# Patient Record
Sex: Female | Born: 1960
Health system: Southern US, Community
[De-identification: ages and names within clinical notes are randomized; demographics above are authoritative.]

## PROBLEM LIST (undated history)

## (undated) DIAGNOSIS — I10 Essential (primary) hypertension: Secondary | ICD-10-CM

## (undated) DIAGNOSIS — M1990 Unspecified osteoarthritis, unspecified site: Secondary | ICD-10-CM

## (undated) HISTORY — PX: POLYPECTOMY: SHX149

## (undated) HISTORY — DX: Unspecified osteoarthritis, unspecified site: M19.90

## (undated) HISTORY — PX: COLONOSCOPY: SHX174

## (undated) HISTORY — DX: Essential (primary) hypertension: I10

---
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
## 1986-08-22 HISTORY — PX: OTHER SURGICAL HISTORY: SHX169

## 1988-08-22 HISTORY — PX: OTHER SURGICAL HISTORY: SHX169

## 2000-08-22 HISTORY — PX: REDUCTION MAMMAPLASTY: SUR839

## 2000-08-22 HISTORY — PX: BREAST SURGERY: SHX581

## 2005-08-26 ENCOUNTER — Ambulatory Visit: Payer: Self-pay

## 2006-09-27 ENCOUNTER — Ambulatory Visit: Payer: Self-pay

## 2006-10-25 ENCOUNTER — Encounter: Payer: Self-pay | Admitting: Specialist

## 2006-11-21 ENCOUNTER — Encounter: Payer: Self-pay | Admitting: Specialist

## 2007-03-19 ENCOUNTER — Inpatient Hospital Stay: Payer: Self-pay | Admitting: Surgery

## 2007-07-03 LAB — HM PAP SMEAR: HM Pap smear: NEGATIVE

## 2007-08-23 HISTORY — PX: GALLBLADDER SURGERY: SHX652

## 2007-12-19 ENCOUNTER — Ambulatory Visit: Payer: Self-pay

## 2009-02-26 ENCOUNTER — Ambulatory Visit: Payer: Self-pay

## 2009-12-28 LAB — HM MAMMOGRAPHY: HM Mammogram: NORMAL

## 2010-04-14 ENCOUNTER — Ambulatory Visit: Payer: Self-pay

## 2011-11-28 ENCOUNTER — Telehealth: Payer: Self-pay | Admitting: Internal Medicine

## 2011-11-28 NOTE — Telephone Encounter (Signed)
[phone] Pt called to get new patient appointment gave her 6/12 she wanted to know if she could be seen sooner.  She went to urgent care Friday night and she has high blood pressure they gave her 1 month bp meds and gave her a list of dr for her to call  primary care.

## 2011-11-28 NOTE — Telephone Encounter (Signed)
We can work her in next available 30-63min slot.

## 2011-11-29 NOTE — Telephone Encounter (Signed)
appointment 12/29/11 pt aware

## 2011-12-29 ENCOUNTER — Encounter: Payer: Self-pay | Admitting: Internal Medicine

## 2011-12-29 ENCOUNTER — Ambulatory Visit (INDEPENDENT_AMBULATORY_CARE_PROVIDER_SITE_OTHER): Payer: PRIVATE HEALTH INSURANCE | Admitting: Internal Medicine

## 2011-12-29 VITALS — BP 122/80 | HR 74 | Temp 98.2°F | Ht 68.5 in | Wt 258.5 lb

## 2011-12-29 DIAGNOSIS — Z1239 Encounter for other screening for malignant neoplasm of breast: Secondary | ICD-10-CM

## 2011-12-29 DIAGNOSIS — I10 Essential (primary) hypertension: Secondary | ICD-10-CM

## 2011-12-29 DIAGNOSIS — B351 Tinea unguium: Secondary | ICD-10-CM | POA: Insufficient documentation

## 2011-12-29 LAB — MICROALBUMIN / CREATININE URINE RATIO
Creatinine,U: 363.1 mg/dL
Microalb Creat Ratio: 0.8 mg/g (ref 0.0–30.0)
Microalb, Ur: 2.9 mg/dL — ABNORMAL HIGH (ref 0.0–1.9)

## 2011-12-29 MED ORDER — LOSARTAN POTASSIUM-HCTZ 50-12.5 MG PO TABS: 1.0000 | ORAL_TABLET | Freq: Every day | ORAL | Status: DC

## 2011-12-29 NOTE — Assessment & Plan Note (Signed)
Blood pressure is normal today. Patient has been off lisinopril hydrochlorothiazide for one week. Her description of cough on this medication is concerning for allergy to ACE inhibitor. Will change to losartan hydrochlorothiazide. However, will have her stay off the medication for the next few days and monitor her blood pressure. She will email or call with results. If blood pressure is trending up, greater than 140/90, will plan to restart losartan hydrochlorothiazide. We'll check renal function and urine microalbumin with labs today. Followup in one month.

## 2011-12-29 NOTE — Assessment & Plan Note (Signed)
Exam is consistent with fungal infection of the left great toenail. We discussed treatment with Lamisil. Will check liver function today. If normal, will start Lamisil and plan to recheck liver function in 6 weeks.

## 2011-12-29 NOTE — Progress Notes (Signed)
Subjective:    Patient ID: Heidi Hicks, female    DOB: 1960/12/12, 51 y.o.   MRN: [id number]  HPI 51YO female with h/o hypertension presents to establish care. She reports that she had been periodically checking her BP for several months, whenever visiting her local pharmacy. She noticed her BP was elevated, often greater than 190/100. She thought this was an error on the part of the machine. She denies any symptoms during this time such as headache, chest pain, palpitations. She was then placed on meloxicam for joint pain in her left ankle. She was told by her orthopedic surgeon that her blood pressure is elevated and there was concern that meloxicam might be contributing. She reports that her blood pressure was noted to be as high as 195/114. She ultimately went to urgent care and she was started on lisinopril hydrochlorothiazide. She has been taking this for the last month but has not been checking her blood pressure. She notes some dry cough since starting this medication. She has been out of the medication for the last week.  In regards to her arthritis pain, she reports a long history of left ankle pain secondary to surgical repair of her fracture. She is currently taking Lodine once daily with improvement in her symptoms.  She is also concerned today about some thickening of her left great toenail. She notes this has been present for several months. She tried applying topical fungal treatments with no improvement.  Outpatient Encounter Prescriptions as of 12/29/2011  Medication Sig Dispense Refill  . etodolac (LODINE) 400 MG tablet Take 400 mg by mouth 2 (two) times daily.      Marland Kitchen losartan-hydrochlorothiazide (HYZAAR) 50-12.5 MG per tablet Take 1 tablet by mouth daily.  30 tablet  3    Review of Systems  Constitutional: Negative for fever, chills, appetite change, fatigue and unexpected weight change.  HENT: Negative for ear pain, congestion, sore throat, trouble swallowing, neck pain, voice  change and sinus pressure.   Eyes: Negative for visual disturbance.  Respiratory: Negative for cough, shortness of breath, wheezing and stridor.   Cardiovascular: Negative for chest pain, palpitations and leg swelling.  Gastrointestinal: Negative for nausea, vomiting, abdominal pain, diarrhea, constipation, blood in stool, abdominal distention and anal bleeding.  Genitourinary: Negative for dysuria and flank pain.  Musculoskeletal: Positive for joint swelling and arthralgias. Negative for myalgias and gait problem.  Skin: Negative for color change and rash.  Neurological: Negative for dizziness and headaches.  Hematological: Negative for adenopathy. Does not bruise/bleed easily.  Psychiatric/Behavioral: Negative for suicidal ideas, sleep disturbance and dysphoric mood. The patient is not nervous/anxious.    BP 122/80  Pulse 74  Temp(Src) 98.2 F (36.8 C) (Oral)  Ht 5' 8.5" (1.74 m)  Wt 258 lb 8 oz (117.255 kg)  BMI 38.73 kg/m2  LMP 07/23/2011     Objective:   Physical Exam  Constitutional: She is oriented to person, place, and time. She appears well-developed and well-nourished. No distress.  HENT:  Head: Normocephalic and atraumatic.  Right Ear: External ear normal.  Left Ear: External ear normal.  Nose: Nose normal.  Mouth/Throat: Oropharynx is clear and moist. No oropharyngeal exudate.  Eyes: Conjunctivae are normal. Pupils are equal, round, and reactive to light. Right eye exhibits no discharge. Left eye exhibits no discharge. No scleral icterus.  Neck: Normal range of motion. Neck supple. No tracheal deviation present. No thyromegaly present.  Cardiovascular: Normal rate, regular rhythm, normal heart sounds and intact distal pulses.  Exam reveals  no gallop and no friction rub.   No murmur heard. Pulmonary/Chest: Effort normal and breath sounds normal. No respiratory distress. She has no wheezes. She has no rales. She exhibits no tenderness.  Abdominal: Soft. Bowel sounds are  normal. She exhibits no distension and no mass. There is no tenderness. There is no guarding.  Musculoskeletal: She exhibits no edema and no tenderness.       Left foot: She exhibits decreased range of motion and swelling.  Lymphadenopathy:    She has no cervical adenopathy.  Neurological: She is alert and oriented to person, place, and time. No cranial nerve deficit. She exhibits normal muscle tone. Coordination normal.  Skin: Skin is warm and dry. No rash noted. She is not diaphoretic. No erythema. No pallor.  Psychiatric: She has a normal mood and affect. Her behavior is normal. Judgment and thought content normal.          Assessment & Plan:

## 2011-12-30 LAB — COMPREHENSIVE METABOLIC PANEL
ALT: 23 U/L (ref 0–35)
CO2: 27 mEq/L (ref 19–32)
Creatinine, Ser: 1 mg/dL (ref 0.4–1.2)
GFR: 73.83 mL/min (ref >60.00)
Glucose, Bld: 81 mg/dL (ref 70–99)
Total Bilirubin: 0.6 mg/dL (ref 0.3–1.2)

## 2012-01-02 ENCOUNTER — Encounter: Payer: Self-pay | Admitting: Internal Medicine

## 2012-01-12 ENCOUNTER — Encounter: Payer: Self-pay | Admitting: Internal Medicine

## 2012-01-12 DIAGNOSIS — B351 Tinea unguium: Secondary | ICD-10-CM

## 2012-01-12 MED ORDER — TERBINAFINE HCL 250 MG PO TABS: 250.0000 mg | ORAL_TABLET | Freq: Every day | ORAL | Status: DC

## 2012-01-30 ENCOUNTER — Encounter: Payer: Self-pay | Admitting: Internal Medicine

## 2012-01-30 ENCOUNTER — Ambulatory Visit (INDEPENDENT_AMBULATORY_CARE_PROVIDER_SITE_OTHER): Payer: PRIVATE HEALTH INSURANCE | Admitting: Internal Medicine

## 2012-01-30 VITALS — BP 132/84 | HR 69 | Temp 98.3°F | Resp 14 | Wt 263.2 lb

## 2012-01-30 DIAGNOSIS — E669 Obesity, unspecified: Secondary | ICD-10-CM

## 2012-01-30 DIAGNOSIS — B351 Tinea unguium: Secondary | ICD-10-CM

## 2012-01-30 DIAGNOSIS — I10 Essential (primary) hypertension: Secondary | ICD-10-CM

## 2012-01-30 MED ORDER — PHENTERMINE HCL 37.5 MG PO CAPS: 37.5000 mg | ORAL_CAPSULE | ORAL | Status: DC

## 2012-01-30 NOTE — Assessment & Plan Note (Signed)
BP well controlled on Losartan-HCTZ. Will continue. Follow up 1 month because of addition of phentermine.

## 2012-01-30 NOTE — Patient Instructions (Signed)
[email or web address]

## 2012-01-30 NOTE — Progress Notes (Signed)
Subjective:    Patient ID: Heidi Hicks, female    DOB: 1961/04/04, 51 y.o.   MRN: [id number]  HPI 51 year old female with history of hypertension presents for followup. She reports that she's been feeling well. She reports full compliance with her losartan hydrochlorothiazide. She denies any chest pain, palpitations, shortness of breath.  She is concerned today about her weight. She reports significant increase in her appetite recently. She notes some dietary indiscretion. She does not regularly exercise because of arthritis pain in her ankle. She is interested in trying appetite suppressant.  Outpatient Encounter Prescriptions as of 01/30/2012  Medication Sig Dispense Refill  . etodolac (LODINE) 400 MG tablet Take 400 mg by mouth 2 (two) times daily.      Marland Kitchen losartan-hydrochlorothiazide (HYZAAR) 50-12.5 MG per tablet Take 1 tablet by mouth daily.  30 tablet  3  . terbinafine (LAMISIL) 250 MG tablet Take 1 tablet (250 mg total) by mouth daily.  30 tablet  1  . phentermine 37.5 MG capsule Take 1 capsule (37.5 mg total) by mouth every morning.  30 capsule  0    Review of Systems  Constitutional: Negative for fever, chills, appetite change, fatigue and unexpected weight change.  HENT: Negative for ear pain, congestion, sore throat, trouble swallowing, neck pain, voice change and sinus pressure.   Eyes: Negative for visual disturbance.  Respiratory: Negative for cough, shortness of breath, wheezing and stridor.   Cardiovascular: Negative for chest pain, palpitations and leg swelling.  Gastrointestinal: Negative for nausea, vomiting, abdominal pain, diarrhea, constipation, blood in stool, abdominal distention and anal bleeding.  Genitourinary: Negative for dysuria and flank pain.  Musculoskeletal: Negative for myalgias, arthralgias and gait problem.  Skin: Negative for color change and rash.  Neurological: Negative for dizziness and headaches.  Hematological: Negative for adenopathy. Does not  bruise/bleed easily.  Psychiatric/Behavioral: Negative for suicidal ideas, sleep disturbance and dysphoric mood. The patient is not nervous/anxious.    BP 132/84  Pulse 69  Temp(Src) 98.3 F (36.8 C) (Oral)  Resp 14  Wt 263 lb 4 oz (119.409 kg)  SpO2 97%     Objective:   Physical Exam  Constitutional: She is oriented to person, place, and time. She appears well-developed and well-nourished. No distress.  HENT:  Head: Normocephalic and atraumatic.  Right Ear: External ear normal.  Left Ear: External ear normal.  Nose: Nose normal.  Mouth/Throat: Oropharynx is clear and moist. No oropharyngeal exudate.  Eyes: Conjunctivae are normal. Pupils are equal, round, and reactive to light. Right eye exhibits no discharge. Left eye exhibits no discharge. No scleral icterus.  Neck: Normal range of motion. Neck supple. No tracheal deviation present. No thyromegaly present.  Cardiovascular: Normal rate, regular rhythm, normal heart sounds and intact distal pulses.  Exam reveals no gallop and no friction rub.   No murmur heard. Pulmonary/Chest: Effort normal and breath sounds normal. No respiratory distress. She has no wheezes. She has no rales. She exhibits no tenderness.  Musculoskeletal: Normal range of motion. She exhibits no edema and no tenderness.  Lymphadenopathy:    She has no cervical adenopathy.  Neurological: She is alert and oriented to person, place, and time. No cranial nerve deficit. She exhibits normal muscle tone. Coordination normal.  Skin: Skin is warm and dry. No rash noted. She is not diaphoretic. No erythema. No pallor.  Psychiatric: She has a normal mood and affect. Her behavior is normal. Judgment and thought content normal.          Assessment &  Plan:

## 2012-01-30 NOTE — Assessment & Plan Note (Signed)
On Lamisil. Liver function test were normal on 12/29/2011. We'll plan to repeat liver function test at her visit in one month.

## 2012-01-30 NOTE — Assessment & Plan Note (Signed)
BMI 39. Encouraged her to keep food diary using [employer]. Encouraged increased physical activity, such as water aerobics. Will start phentermine to help with appetite suppression. Goal weight loss 1-2lbs per week. Follow up 1 month.

## 2012-02-01 ENCOUNTER — Ambulatory Visit: Payer: Self-pay | Admitting: Internal Medicine

## 2012-02-07 ENCOUNTER — Encounter: Payer: Self-pay | Admitting: Internal Medicine

## 2012-02-09 ENCOUNTER — Encounter: Payer: Self-pay | Admitting: Internal Medicine

## 2012-03-12 ENCOUNTER — Other Ambulatory Visit: Payer: Self-pay | Admitting: *Deleted

## 2012-03-12 ENCOUNTER — Encounter: Payer: Self-pay | Admitting: Internal Medicine

## 2012-03-12 DIAGNOSIS — E669 Obesity, unspecified: Secondary | ICD-10-CM

## 2012-03-13 MED ORDER — PHENTERMINE HCL 37.5 MG PO CAPS: 37.5000 mg | ORAL_CAPSULE | ORAL | Status: DC

## 2012-03-13 NOTE — Telephone Encounter (Signed)
Rx called to [facility], patient advised via telephone. 

## 2012-03-28 ENCOUNTER — Encounter: Payer: Self-pay | Admitting: Internal Medicine

## 2012-03-28 ENCOUNTER — Other Ambulatory Visit: Payer: Self-pay | Admitting: Internal Medicine

## 2012-04-17 ENCOUNTER — Encounter: Payer: Self-pay | Admitting: Internal Medicine

## 2012-05-02 ENCOUNTER — Ambulatory Visit: Payer: PRIVATE HEALTH INSURANCE | Admitting: Internal Medicine

## 2012-05-10 ENCOUNTER — Other Ambulatory Visit: Payer: Self-pay | Admitting: Internal Medicine

## 2012-05-16 ENCOUNTER — Other Ambulatory Visit: Payer: Self-pay | Admitting: Internal Medicine

## 2012-09-15 ENCOUNTER — Other Ambulatory Visit: Payer: Self-pay | Admitting: Internal Medicine

## 2012-10-06 ENCOUNTER — Other Ambulatory Visit: Payer: Self-pay

## 2012-10-17 ENCOUNTER — Other Ambulatory Visit: Payer: Self-pay | Admitting: Internal Medicine

## 2012-11-18 ENCOUNTER — Other Ambulatory Visit: Payer: Self-pay | Admitting: Internal Medicine

## 2012-12-24 ENCOUNTER — Other Ambulatory Visit: Payer: Self-pay | Admitting: Internal Medicine

## 2013-01-18 ENCOUNTER — Encounter: Payer: Self-pay | Admitting: Internal Medicine

## 2013-01-18 ENCOUNTER — Other Ambulatory Visit: Payer: Self-pay | Admitting: Internal Medicine

## 2013-01-18 MED ORDER — LOSARTAN POTASSIUM-HCTZ 50-12.5 MG PO TABS: ORAL_TABLET | ORAL | Status: DC

## 2013-02-04 ENCOUNTER — Encounter: Payer: Self-pay | Admitting: Internal Medicine

## 2013-02-04 ENCOUNTER — Ambulatory Visit (INDEPENDENT_AMBULATORY_CARE_PROVIDER_SITE_OTHER): Payer: PRIVATE HEALTH INSURANCE | Admitting: Internal Medicine

## 2013-02-04 VITALS — BP 120/96 | HR 71 | Temp 97.7°F | Wt 266.0 lb

## 2013-02-04 DIAGNOSIS — E669 Obesity, unspecified: Secondary | ICD-10-CM

## 2013-02-04 DIAGNOSIS — I10 Essential (primary) hypertension: Secondary | ICD-10-CM

## 2013-02-04 DIAGNOSIS — B351 Tinea unguium: Secondary | ICD-10-CM

## 2013-02-04 DIAGNOSIS — Z1211 Encounter for screening for malignant neoplasm of colon: Secondary | ICD-10-CM

## 2013-02-04 DIAGNOSIS — M25572 Pain in left ankle and joints of left foot: Secondary | ICD-10-CM | POA: Insufficient documentation

## 2013-02-04 DIAGNOSIS — M2550 Pain in unspecified joint: Secondary | ICD-10-CM

## 2013-02-04 LAB — COMPREHENSIVE METABOLIC PANEL
Alkaline Phosphatase: 87 U/L (ref 39–117)
BUN: 15 mg/dL (ref 6–23)
Glucose, Bld: 90 mg/dL (ref 70–99)
Total Bilirubin: 0.3 mg/dL (ref 0.3–1.2)

## 2013-02-04 LAB — LIPID PANEL
Cholesterol: 170 mg/dL (ref 0–200)
Triglycerides: 120 mg/dL (ref 0.0–149.0)

## 2013-02-04 LAB — MICROALBUMIN / CREATININE URINE RATIO
Creatinine,U: 136.8 mg/dL
Microalb Creat Ratio: 2.6 mg/g (ref 0.0–30.0)
Microalb, Ur: 3.6 mg/dL — ABNORMAL HIGH (ref 0.0–1.9)

## 2013-02-04 MED ORDER — PHENTERMINE HCL 37.5 MG PO CAPS: 37.5000 mg | ORAL_CAPSULE | ORAL | Status: DC

## 2013-02-04 MED ORDER — LOSARTAN POTASSIUM-HCTZ 50-12.5 MG PO TABS: ORAL_TABLET | ORAL | Status: DC

## 2013-02-04 MED ORDER — MELOXICAM 15 MG PO TABS: 15.0000 mg | ORAL_TABLET | Freq: Every day | ORAL | Status: DC

## 2013-02-04 NOTE — Assessment & Plan Note (Signed)
BP Readings from Last 3 Encounters:  02/04/13 120/96  01/30/12 132/84  12/29/11 122/80   BP generally well controlled on current medication. Will check renal function with labs today. Continue current medication.

## 2013-02-04 NOTE — Progress Notes (Signed)
Subjective:    Patient ID: Heidi Hicks, female    DOB: 02-Nov-1960, 52 y.o.   MRN: [id number]  HPI 52 year old female with history of osteoarthritis, hypertension, obesity presents for followup. She has been lost to followup for one year. She reports she is generally doing well. She is compliant with her medication. She reports blood pressures been well-controlled however she did not bring record of blood pressure readings with her today. She denies any chest pain, palpitations, headache.  She is concerned about weight gain. In the past, she did very well with use of phentermine to help with appetite suppression. She would like to try this medication again. She has been trying to limit caloric intake and increase her physical activity.  Outpatient Encounter Prescriptions as of 02/04/2013  Medication Sig Dispense Refill  . losartan-hydrochlorothiazide (HYZAAR) 50-12.5 MG per tablet TAKE 1 TABLET BY MOUTH EVERY DAY  90 tablet  4  . meloxicam (MOBIC) 15 MG tablet Take 1 tablet (15 mg total) by mouth daily.  90 tablet  1   No facility-administered encounter medications on file as of 02/04/2013.   BP 120/96  Pulse 71  Temp(Src) 97.7 F (36.5 C) (Oral)  Wt 266 lb (120.657 kg)  BMI 39.85 kg/m2  SpO2 95%  Review of Systems  Constitutional: Negative for fever, chills, appetite change, fatigue and unexpected weight change.  HENT: Negative for ear pain, congestion, sore throat, trouble swallowing, neck pain, voice change and sinus pressure.   Eyes: Negative for visual disturbance.  Respiratory: Negative for cough, shortness of breath, wheezing and stridor.   Cardiovascular: Negative for chest pain, palpitations and leg swelling.  Gastrointestinal: Negative for nausea, vomiting, abdominal pain, diarrhea, constipation, blood in stool, abdominal distention and anal bleeding.  Genitourinary: Negative for dysuria and flank pain.  Musculoskeletal: Positive for myalgias and arthralgias. Negative for  gait problem.  Skin: Negative for color change and rash.  Neurological: Negative for dizziness and headaches.  Hematological: Negative for adenopathy. Does not bruise/bleed easily.  Psychiatric/Behavioral: Negative for suicidal ideas, sleep disturbance and dysphoric mood. The patient is not nervous/anxious.        Objective:   Physical Exam  Constitutional: She is oriented to person, place, and time. She appears well-developed and well-nourished. No distress.  HENT:  Head: Normocephalic and atraumatic.  Right Ear: External ear normal.  Left Ear: External ear normal.  Nose: Nose normal.  Mouth/Throat: Oropharynx is clear and moist. No oropharyngeal exudate.  Eyes: Conjunctivae are normal. Pupils are equal, round, and reactive to light. Right eye exhibits no discharge. Left eye exhibits no discharge. No scleral icterus.  Neck: Normal range of motion. Neck supple. No tracheal deviation present. No thyromegaly present.  Cardiovascular: Normal rate, regular rhythm, normal heart sounds and intact distal pulses.  Exam reveals no gallop and no friction rub.   No murmur heard. Pulmonary/Chest: Effort normal and breath sounds normal. No accessory muscle usage. Not tachypneic. No respiratory distress. She has no decreased breath sounds. She has no wheezes. She has no rhonchi. She has no rales. She exhibits no tenderness.  Musculoskeletal: Normal range of motion. She exhibits no edema and no tenderness.  Lymphadenopathy:    She has no cervical adenopathy.  Neurological: She is alert and oriented to person, place, and time. No cranial nerve deficit. She exhibits normal muscle tone. Coordination normal.  Skin: Skin is warm and dry. No rash noted. She is not diaphoretic. No erythema. No pallor.  Psychiatric: She has a normal mood and affect. Her  behavior is normal. Judgment and thought content normal.          Assessment & Plan:

## 2013-02-04 NOTE — Assessment & Plan Note (Signed)
Will continue meloxicam as this has worked well for her. Discussed potential of elevated blood pressure on this medication. Will monitor.

## 2013-02-04 NOTE — Assessment & Plan Note (Signed)
Wt Readings from Last 3 Encounters:  02/04/13 266 lb (120.657 kg)  01/30/12 263 lb 4 oz (119.409 kg)  12/29/11 258 lb 8 oz (117.255 kg)   Body mass index is 39.85 kg/(m^2). Encouraged healthy diet, with caloric restriction and goal of weight loss. Encouraged regular physical activity such as walking. Patient did very well on phentermine in the past. Will resume phentermine to help with appetite suppression. Followup in one month.

## 2013-02-04 NOTE — Assessment & Plan Note (Signed)
Patient reports symptoms improved with Lamisil then recurred. Will recheck hepatic function today. If normal, will plan to proceed with another month of Lamisil.

## 2013-02-04 NOTE — Assessment & Plan Note (Signed)
>>  ASSESSMENT AND PLAN FOR PAIN IN LEFT ANKLE AND JOINTS OF LEFT FOOT WRITTEN ON 02/04/2013 12:45 PM BY Otho Blitz, JENNIFER A, MD  Will continue meloxicam  as this has worked well for her. Discussed potential of elevated blood pressure on this medication. Will monitor.

## 2013-02-12 ENCOUNTER — Encounter: Payer: Self-pay | Admitting: Internal Medicine

## 2013-02-12 MED ORDER — TERBINAFINE HCL 250 MG PO TABS: 250.0000 mg | ORAL_TABLET | Freq: Every day | ORAL | Status: DC

## 2013-02-14 ENCOUNTER — Encounter: Payer: Self-pay | Admitting: Internal Medicine

## 2013-02-14 ENCOUNTER — Encounter: Payer: Self-pay | Admitting: Emergency Medicine

## 2013-02-19 ENCOUNTER — Ambulatory Visit: Payer: PRIVATE HEALTH INSURANCE | Admitting: Internal Medicine

## 2013-03-06 ENCOUNTER — Ambulatory Visit: Payer: Self-pay | Admitting: Internal Medicine

## 2013-03-06 ENCOUNTER — Other Ambulatory Visit: Payer: Self-pay | Admitting: Internal Medicine

## 2013-03-06 DIAGNOSIS — E669 Obesity, unspecified: Secondary | ICD-10-CM

## 2013-03-14 MED ORDER — PHENTERMINE HCL 37.5 MG PO CAPS: 37.5000 mg | ORAL_CAPSULE | ORAL | Status: DC

## 2013-03-14 NOTE — Telephone Encounter (Signed)
Rx printed to be signed and ready for patient to pick up

## 2013-03-21 ENCOUNTER — Encounter: Payer: Self-pay | Admitting: Internal Medicine

## 2013-03-25 ENCOUNTER — Encounter: Payer: PRIVATE HEALTH INSURANCE | Admitting: Internal Medicine

## 2013-03-27 ENCOUNTER — Ambulatory Visit (INDEPENDENT_AMBULATORY_CARE_PROVIDER_SITE_OTHER): Payer: PRIVATE HEALTH INSURANCE | Admitting: Internal Medicine

## 2013-03-27 ENCOUNTER — Encounter: Payer: Self-pay | Admitting: Internal Medicine

## 2013-03-27 VITALS — BP 124/82 | HR 78 | Temp 98.3°F | Wt 260.0 lb

## 2013-03-27 DIAGNOSIS — E669 Obesity, unspecified: Secondary | ICD-10-CM

## 2013-03-27 DIAGNOSIS — I10 Essential (primary) hypertension: Secondary | ICD-10-CM

## 2013-03-27 DIAGNOSIS — Z23 Encounter for immunization: Secondary | ICD-10-CM

## 2013-03-27 LAB — HM COLONOSCOPY

## 2013-03-27 MED ORDER — PHENTERMINE HCL 37.5 MG PO CAPS: 37.5000 mg | ORAL_CAPSULE | ORAL | Status: DC

## 2013-03-27 MED ORDER — TETANUS-DIPHTH-ACELL PERTUSSIS 5-2.5-18.5 LF-MCG/0.5 IM SUSP: 0.5000 mL | Freq: Once | INTRAMUSCULAR | Status: DC

## 2013-03-27 NOTE — Progress Notes (Signed)
  Subjective:    Patient ID: Heidi Hicks, female    DOB: 04-21-1961, 52 y.o.   MRN: [id number]  HPI 52YO female with h/o hypertension, obesity presents for follow up. Doing well. Has lost 6lbs since last visit. Following healthy diet, low in sugars and fats. Exercising by walking almost daily. No concerns today. No side effects noted from Phentermine. No headache, palpitations, chest pain.  Outpatient Encounter Prescriptions as of 03/27/2013  Medication Sig Dispense Refill  . losartan-hydrochlorothiazide (HYZAAR) 50-12.5 MG per tablet TAKE 1 TABLET BY MOUTH EVERY DAY  90 tablet  4  . meloxicam (MOBIC) 15 MG tablet Take 1 tablet (15 mg total) by mouth daily.  90 tablet  1  . phentermine 37.5 MG capsule Take 1 capsule (37.5 mg total) by mouth every morning.  30 capsule  1   No facility-administered encounter medications on file as of 03/27/2013.   BP 124/82  Pulse 78  Temp(Src) 98.3 F (36.8 C) (Oral)  Wt 260 lb (117.935 kg)  BMI 38.95 kg/m2  SpO2 98%  Review of Systems  Constitutional: Negative for fever, chills, appetite change, fatigue and unexpected weight change.  HENT: Negative for ear pain, congestion, sore throat, trouble swallowing, neck pain, voice change and sinus pressure.   Eyes: Negative for visual disturbance.  Respiratory: Negative for cough, shortness of breath, wheezing and stridor.   Cardiovascular: Negative for chest pain, palpitations and leg swelling.  Gastrointestinal: Negative for nausea, vomiting, abdominal pain, diarrhea, constipation, blood in stool, abdominal distention and anal bleeding.  Genitourinary: Negative for dysuria and flank pain.  Musculoskeletal: Negative for myalgias, arthralgias and gait problem.  Skin: Negative for color change and rash.  Neurological: Negative for dizziness and headaches.  Hematological: Negative for adenopathy. Does not bruise/bleed easily.  Psychiatric/Behavioral: Negative for suicidal ideas, sleep disturbance and dysphoric  mood. The patient is not nervous/anxious.        Objective:   Physical Exam  Constitutional: She is oriented to person, place, and time. She appears well-developed and well-nourished. No distress.  HENT:  Head: Normocephalic and atraumatic.  Right Ear: External ear normal.  Left Ear: External ear normal.  Nose: Nose normal.  Mouth/Throat: Oropharynx is clear and moist. No oropharyngeal exudate.  Eyes: Conjunctivae are normal. Pupils are equal, round, and reactive to light. Right eye exhibits no discharge. Left eye exhibits no discharge. No scleral icterus.  Neck: Normal range of motion. Neck supple. No tracheal deviation present. No thyromegaly present.  Cardiovascular: Normal rate, regular rhythm, normal heart sounds and intact distal pulses.  Exam reveals no gallop and no friction rub.   No murmur heard. Pulmonary/Chest: Effort normal and breath sounds normal. No accessory muscle usage. Not tachypneic. No respiratory distress. She has no decreased breath sounds. She has no wheezes. She has no rhonchi. She has no rales. She exhibits no tenderness.  Musculoskeletal: Normal range of motion. She exhibits no edema and no tenderness.  Lymphadenopathy:    She has no cervical adenopathy.  Neurological: She is alert and oriented to person, place, and time. No cranial nerve deficit. She exhibits normal muscle tone. Coordination normal.  Skin: Skin is warm and dry. No rash noted. She is not diaphoretic. No erythema. No pallor.  Psychiatric: She has a normal mood and affect. Her behavior is normal. Judgment and thought content normal.          Assessment & Plan:

## 2013-03-27 NOTE — Addendum Note (Signed)
Addended by: Chandra Batch E on: 03/27/2013 09:25 AM   Modules accepted: Orders

## 2013-03-27 NOTE — Assessment & Plan Note (Signed)
BP Readings from Last 3 Encounters:  03/27/13 124/82  02/04/13 120/96  01/30/12 132/84   BP generally well controlled on Losartan-HCTZ. Will continue to monitor.

## 2013-03-27 NOTE — Assessment & Plan Note (Signed)
Wt Readings from Last 3 Encounters:  03/27/13 260 lb (117.935 kg)  02/04/13 266 lb (120.657 kg)  01/30/12 263 lb 4 oz (119.409 kg)   Congratulated pt on weight loss. Encouraged her to continue efforts at healthy diet and regular physical activity. Will continue phentermine for appetite suppression. Follow up in 4-6 weeks.

## 2013-04-08 ENCOUNTER — Ambulatory Visit (AMBULATORY_SURGERY_CENTER): Payer: PRIVATE HEALTH INSURANCE | Admitting: *Deleted

## 2013-04-08 VITALS — Ht 69.0 in | Wt 264.4 lb

## 2013-04-08 DIAGNOSIS — Z1211 Encounter for screening for malignant neoplasm of colon: Secondary | ICD-10-CM

## 2013-04-08 MED ORDER — MOVIPREP 100 G PO SOLR: 1.0000 | Freq: Once | ORAL | Status: DC

## 2013-04-08 NOTE — Progress Notes (Signed)
No egg or soy allergy. No anesthesia problems.  

## 2013-04-11 ENCOUNTER — Encounter: Payer: Self-pay | Admitting: Internal Medicine

## 2013-04-26 ENCOUNTER — Encounter: Payer: Self-pay | Admitting: Internal Medicine

## 2013-04-26 ENCOUNTER — Ambulatory Visit (AMBULATORY_SURGERY_CENTER): Payer: PRIVATE HEALTH INSURANCE | Admitting: Internal Medicine

## 2013-04-26 VITALS — BP 119/72 | HR 63 | Temp 95.7°F | Resp 31 | Ht 69.0 in | Wt 264.0 lb

## 2013-04-26 DIAGNOSIS — D126 Benign neoplasm of colon, unspecified: Secondary | ICD-10-CM

## 2013-04-26 DIAGNOSIS — Z1211 Encounter for screening for malignant neoplasm of colon: Secondary | ICD-10-CM

## 2013-04-26 MED ORDER — SODIUM CHLORIDE 0.9 % IV SOLN: 500.0000 mL | INTRAVENOUS | Status: DC

## 2013-04-26 NOTE — Progress Notes (Signed)
No complaints noted in the recovery room. Maw   

## 2013-04-26 NOTE — Patient Instructions (Addendum)
YOU HAD AN ENDOSCOPIC PROCEDURE TODAY AT THE [facility]: Refer to the procedure report that was given to you for any specific questions about what was found during the examination.  If the procedure report does not answer your questions, please call your gastroenterologist to clarify.  If you requested that your care partner not be given the details of your procedure findings, then the procedure report has been included in a sealed envelope for you to review at your convenience later.  YOU SHOULD EXPECT: Some feelings of bloating in the abdomen. Passage of more gas than usual.  Walking can help get rid of the air that was put into your GI tract during the procedure and reduce the bloating. If you had a lower endoscopy (such as a colonoscopy or flexible sigmoidoscopy) you may notice spotting of blood in your stool or on the toilet paper. If you underwent a bowel prep for your procedure, then you may not have a normal bowel movement for a few days.  DIET: Your first meal following the procedure should be a light meal and then it is ok to progress to your normal diet.  A half-sandwich or bowl of soup is an example of a good first meal.  Heavy or fried foods are harder to digest and may make you feel nauseous or bloated.  Likewise meals heavy in dairy and vegetables can cause extra gas to form and this can also increase the bloating.  Drink plenty of fluids but you should avoid alcoholic beverages for 24 hours.  ACTIVITY: Your care partner should take you home directly after the procedure.  You should plan to take it easy, moving slowly for the rest of the day.  You can resume normal activity the day after the procedure however you should NOT DRIVE or use heavy machinery for 24 hours (because of the sedation medicines used during the test).    SYMPTOMS TO REPORT IMMEDIATELY: A gastroenterologist can be reached at any hour.  During normal business hours, 8:30 AM to 5:00 PM Monday through Friday,  call [phone].  After hours and on weekends, please call the GI answering service at [phone] who will take a message and have the physician on call contact you.   Following lower endoscopy (colonoscopy or flexible sigmoidoscopy):  Excessive amounts of blood in the stool  Significant tenderness or worsening of abdominal pains  Swelling of the abdomen that is new, acute  Fever of 100F or higher   FOLLOW UP: If any biopsies were taken you will be contacted by phone or by letter within the next 1-3 weeks.  Call your gastroenterologist if you have not heard about the biopsies in 3 weeks.  Our staff will call the home number listed on your records the next business day following your procedure to check on you and address any questions or concerns that you may have at that time regarding the information given to you following your procedure. This is a courtesy call and so if there is no answer at the home number and we have not heard from you through the emergency physician on call, we will assume that you have returned to your regular daily activities without incident.  SIGNATURES/CONFIDENTIALITY: You and/or your care partner have signed paperwork which will be entered into your electronic medical record.  These signatures attest to the fact that that the information above on your After Visit Summary has been reviewed and is understood.  Full responsibility of the confidentiality of   this discharge information lies with you and/or your care-partner.    Handouts were given to your care partner on polyps, diverticulosis, a high fiber diet and hemorrhoids. You may resume your current medications today. Please call if any questions or concerns.

## 2013-04-26 NOTE — Progress Notes (Signed)
Called to room to assist during endoscopic procedure.  Patient ID and intended procedure confirmed with present staff. Received instructions for my participation in the procedure from the performing physician.  

## 2013-04-26 NOTE — Progress Notes (Signed)
Report to pacu rn, vss, bbs=clear 

## 2013-04-26 NOTE — Op Note (Signed)
[facility] [address] [place], [address]   COLONOSCOPY PROCEDURE REPORT  PATIENT: Heidi Hicks, Heidi Hicks  MR#: [id number] BIRTHDATE: [birth year] , 52  yrs. old GENDER: Female ENDOSCOPIST: Beverley Fiedler, MD REFERRED [id number] Dan Humphreys, M.D. PROCEDURE DATE:  04/26/2013 PROCEDURE:   Colonoscopy with snare polypectomy First Screening Colonoscopy - Avg.  risk and is 50 yrs.  old or older Yes.  Prior Negative Screening - Now for repeat screening. N/A  History of Adenoma - Now for follow-up colonoscopy & has been > or = to 3 yrs.  N/A  Polyps Removed Today? Yes. ASA CLASS:   Class II INDICATIONS:average risk screening and first colonoscopy. MEDICATIONS: MAC sedation, administered by CRNA and Propofol (Diprivan) 380 mg IV  DESCRIPTION OF PROCEDURE:   After the risks benefits and alternatives of the procedure were thoroughly explained, informed consent was obtained.  A digital rectal exam revealed external hemorrhoids.   The LB [id number]-[id number]  endoscope was introduced through the anus and advanced to the cecum, which was identified by both the appendix and ileocecal valve. No adverse events experienced.   The quality of the prep was good, using MoviPrep The instrument was then slowly withdrawn as the colon was fully examined.   COLON FINDINGS: A sessile polyp measuring 5 mm in size was found in the transverse colon.  A polypectomy was performed with a cold snare.  The resection was complete and the polyp tissue was completely retrieved.   Mild diverticulosis was noted in the proximal ascending colon and sigmoid colon.  Retroflexed views revealed external hemorrhoids. The time to cecum=4 minutes 49 seconds.  Withdrawal time=15 minutes 03 seconds.  The scope was withdrawn and the procedure completed. COMPLICATIONS: There were no complications.  ENDOSCOPIC IMPRESSION: 1.   Sessile polyp measuring 5 mm in size was found in the transverse colon; polypectomy was performed  with a cold snare 2.   Mild diverticulosis was noted in the proximal ascending colon and sigmoid colon  RECOMMENDATIONS: 1.  Await pathology results 2.  High fiber diet 3.  If the polyp removed today is proven to be an adenomatous (pre-cancerous) polyp, you will need a repeat colonoscopy in 5 years.  Otherwise you should continue to follow colorectal cancer screening guidelines for "routine risk" patients with colonoscopy in 10 years.  You will receive a letter within 1-2 weeks with the results of your biopsy as well as final recommendations.  Please call my office if you have not received a letter after 3 weeks.   eSigned:  Beverley Fiedler, MD 04/26/2013 11:24 AM cc: The Patient and Ronna Polio MD

## 2013-04-29 ENCOUNTER — Telehealth: Payer: Self-pay

## 2013-04-29 NOTE — Telephone Encounter (Signed)
  Follow up Call-  Call back number 04/26/2013  Post procedure Call Back phone  # [phone]  Permission to leave phone message Yes     Patient questions:  Do you have a fever, pain , or abdominal swelling? no Pain Score  0 *  Have you tolerated food without any problems? yes  Have you been able to return to your normal activities? yes  Do you have any questions about your discharge instructions: Diet   no Medications  no Follow up visit  no  Do you have questions or concerns about your Care? no  Actions: * If pain score is 4 or above: No action needed, pain <4.

## 2013-05-01 ENCOUNTER — Encounter: Payer: Self-pay | Admitting: Internal Medicine

## 2013-05-29 ENCOUNTER — Other Ambulatory Visit: Payer: Self-pay | Admitting: Internal Medicine

## 2013-05-30 ENCOUNTER — Encounter: Payer: Self-pay | Admitting: Adult Health

## 2013-05-30 ENCOUNTER — Ambulatory Visit (INDEPENDENT_AMBULATORY_CARE_PROVIDER_SITE_OTHER): Payer: PRIVATE HEALTH INSURANCE | Admitting: Adult Health

## 2013-05-30 VITALS — BP 110/70 | HR 75 | Temp 97.8°F | Resp 12 | Wt 258.0 lb

## 2013-05-30 DIAGNOSIS — E669 Obesity, unspecified: Secondary | ICD-10-CM

## 2013-05-30 MED ORDER — CLOBETASOL PROPIONATE 0.05 % EX CREA: 1.0000 "application " | TOPICAL_CREAM | Freq: Two times a day (BID) | CUTANEOUS | Status: DC

## 2013-05-30 MED ORDER — PHENTERMINE HCL 37.5 MG PO CAPS: 37.5000 mg | ORAL_CAPSULE | ORAL | Status: DC

## 2013-05-30 NOTE — Assessment & Plan Note (Addendum)
Starting weight 266 pounds. Weight today 258 pounds. Blood pressure 110/70. Refill phentermine.

## 2013-05-30 NOTE — Telephone Encounter (Signed)
She is coming in today to see you Raquel. I will leave this open until then.

## 2013-05-30 NOTE — Progress Notes (Signed)
  Subjective:    Patient ID: Heidi Hicks, female    DOB: Sep 12, 1960, 52 y.o.   MRN: [id number]  HPI  Patient is a pleasant 52 year old female who presents to clinic for phentermine refill. She started off weighting 266 pounds. Today she is weighing 258. She is encouraged and feels she is doing well on the phentermine.   Current Outpatient Prescriptions on File Prior to Visit  Medication Sig Dispense Refill  . losartan-hydrochlorothiazide (HYZAAR) 50-12.5 MG per tablet Take 1 tablet by mouth daily.      . meloxicam (MOBIC) 15 MG tablet Take 15 mg by mouth daily.       Current Facility-Administered Medications on File Prior to Visit  Medication Dose Route Frequency Provider Last Rate Last Dose  . TDaP (BOOSTRIX) injection 0.5 mL  0.5 mL Intramuscular Once Wynona Dove, MD          Review of Systems  Constitutional: Negative.   Respiratory: Negative.   Cardiovascular: Negative.   Gastrointestinal: Negative.        Objective:   Physical Exam  Constitutional: She is oriented to person, place, and time. No distress.  Cardiovascular: Normal rate and regular rhythm.   Pulmonary/Chest: Effort normal. No respiratory distress.  Neurological: She is alert and oriented to person, place, and time.  Psychiatric: She has a normal mood and affect. Her behavior is normal. Judgment and thought content normal.          Assessment & Plan:

## 2013-06-27 ENCOUNTER — Other Ambulatory Visit: Payer: Self-pay

## 2013-07-25 ENCOUNTER — Other Ambulatory Visit: Payer: Self-pay | Admitting: Internal Medicine

## 2013-07-25 NOTE — Telephone Encounter (Signed)
Refill

## 2013-07-30 ENCOUNTER — Other Ambulatory Visit: Payer: Self-pay | Admitting: Adult Health

## 2013-08-01 ENCOUNTER — Other Ambulatory Visit: Payer: Self-pay | Admitting: Adult Health

## 2013-08-01 MED ORDER — PHENTERMINE HCL 37.5 MG PO CAPS: 37.5000 mg | ORAL_CAPSULE | ORAL | Status: DC

## 2013-08-01 NOTE — Telephone Encounter (Signed)
May refill x 1 month, but pt will need to schedule follow up

## 2013-08-16 NOTE — Telephone Encounter (Signed)
Mailed unread message to pt  

## 2013-10-01 ENCOUNTER — Encounter: Payer: Self-pay | Admitting: Adult Health

## 2013-10-01 ENCOUNTER — Ambulatory Visit (INDEPENDENT_AMBULATORY_CARE_PROVIDER_SITE_OTHER): Payer: PRIVATE HEALTH INSURANCE | Admitting: Adult Health

## 2013-10-01 VITALS — BP 140/82 | HR 76 | Temp 97.6°F | Resp 14 | Wt 268.8 lb

## 2013-10-01 DIAGNOSIS — Z79899 Other long term (current) drug therapy: Secondary | ICD-10-CM

## 2013-10-01 DIAGNOSIS — E669 Obesity, unspecified: Secondary | ICD-10-CM

## 2013-10-01 MED ORDER — PHENTERMINE HCL 37.5 MG PO TABS: 37.5000 mg | ORAL_TABLET | Freq: Every day | ORAL | Status: DC

## 2013-10-01 NOTE — Progress Notes (Signed)
Pre visit review using our clinic review tool, if applicable. No additional management support is needed unless otherwise documented below in the visit note. 

## 2013-10-01 NOTE — Progress Notes (Signed)
Patient ID: Heidi Hicks, female   DOB: 09/21/1960, 53 y.o.   MRN: [id number]    Subjective:    Patient ID: Heidi Hicks, female    DOB: [birth year], 53 y.o.   MRN: [id number]  HPI  Pleasant 53 year old female who presents to clinic for refills of phentermine and supervised weight loss. She has gained 10 pounds since her last visit. She reports being very disappointed. Reports that she pulled her hamstring in the parking lot of a [employer] and was unable to exercise. Mainly, her exercise consists of walking briskly. She had been doing well previously. She had started off at 266 pounds and later went down to 258 pounds. She denies having any side effects from the medication.  Past Medical History  Diagnosis Date  . Arthritis   . Hypertension     Current Outpatient Prescriptions on File Prior to Visit  Medication Sig Dispense Refill  . clobetasol cream (TEMOVATE) 1.76 % Apply 1 application topically 2 (two) times daily.  30 g  3  . losartan-hydrochlorothiazide (HYZAAR) 50-12.5 MG per tablet Take 1 tablet by mouth daily.      Marland Kitchen losartan-hydrochlorothiazide (HYZAAR) 50-12.5 MG per tablet TAKE 1 TABLET BY MOUTH EVERY DAY  90 tablet  1  . meloxicam (MOBIC) 15 MG tablet TAKE 1 TABLET BY MOUTH EVERY DAY  90 tablet  1   Current Facility-Administered Medications on File Prior to Visit  Medication Dose Route Frequency Provider Last Rate Last Dose  . TDaP (BOOSTRIX) injection 0.5 mL  0.5 mL Intramuscular Once Jackolyn Confer, MD         Review of Systems  Constitutional: Negative.   HENT: Negative.   Eyes: Negative.   Respiratory: Negative.   Cardiovascular: Negative.   Gastrointestinal: Negative.   Endocrine: Negative.   Genitourinary: Negative.   Musculoskeletal: Negative.   Skin: Negative.   Allergic/Immunologic: Negative.   Neurological: Negative.   Hematological: Negative.   Psychiatric/Behavioral: Negative.        Objective:  BP 140/82  Pulse 76  Temp(Src) 97.6 F (36.4  C) (Oral)  Resp 14  Wt 268 lb 12 oz (121.904 kg)  SpO2 99%   Physical Exam  Constitutional: She is oriented to person, place, and time. No distress.  Cardiovascular: Normal rate, regular rhythm, normal heart sounds and intact distal pulses.  Exam reveals no gallop.   No murmur heard. Pulmonary/Chest: Breath sounds normal. No respiratory distress. She has no rales.  Musculoskeletal: She exhibits no edema.  Neurological: She is alert and oriented to person, place, and time.  Skin: Skin is warm and dry.  Psychiatric: She has a normal mood and affect. Her behavior is normal. Judgment and thought content normal.       Assessment & Plan:   1. Obesity Weight increase of 10 lb. She reports injury preventing her from exercising. Now ready to start. Would like refill on phentermine. Discussed importance of learning new, healthy habits while taking phentermine. Will prescribe for 1 month. RTC for f/u in one month. If no weight loss will discontinue medication. Recommend that she find fun activities to do as exercise such as a Zumba class. She currently walks with her daughter.  2. Medication management Prescription for phentermine given for 1 month, Refills 0

## 2013-11-14 ENCOUNTER — Ambulatory Visit (INDEPENDENT_AMBULATORY_CARE_PROVIDER_SITE_OTHER): Payer: PRIVATE HEALTH INSURANCE | Admitting: Adult Health

## 2013-11-14 ENCOUNTER — Encounter: Payer: Self-pay | Admitting: Adult Health

## 2013-11-14 VITALS — BP 116/80 | HR 80 | Temp 97.7°F | Resp 14 | Wt 260.0 lb

## 2013-11-14 DIAGNOSIS — E669 Obesity, unspecified: Secondary | ICD-10-CM

## 2013-11-14 MED ORDER — PHENTERMINE HCL 37.5 MG PO TABS: 37.5000 mg | ORAL_TABLET | Freq: Every day | ORAL | Status: DC

## 2013-11-14 NOTE — Progress Notes (Signed)
Pre visit review using our clinic review tool, if applicable. No additional management support is needed unless otherwise documented below in the visit note. 

## 2013-11-14 NOTE — Progress Notes (Signed)
Patient ID: Heidi Hicks, female   DOB: 1961/04/06, 53 y.o.   MRN: [id number]   Subjective:    Patient ID: Heidi Hicks, female    DOB: Jul 20, 1961, 53 y.o.   MRN: [id number]  HPI  Pt is a pleasant 53 y/o female who presents to clinic for follow up weight loss management. She has been taking phentermine without any side effects reported. B/P is very well controlled. She is very motivated. Reports decreasing sugar intake and increasing exercise since the weather is improving. She was last seen on 10/01/13 and has lost 8 lbs since.  10/01/13 - 268 lbs 11/14/13 - 260 lbs  Past Medical History  Diagnosis Date  . Arthritis   . Hypertension     Current Outpatient Prescriptions on File Prior to Visit  Medication Sig Dispense Refill  . clobetasol cream (TEMOVATE) 2.58 % Apply 1 application topically 2 (two) times daily.  30 g  3  . losartan-hydrochlorothiazide (HYZAAR) 50-12.5 MG per tablet TAKE 1 TABLET BY MOUTH EVERY DAY  90 tablet  1  . meloxicam (MOBIC) 15 MG tablet TAKE 1 TABLET BY MOUTH EVERY DAY  90 tablet  1   Current Facility-Administered Medications on File Prior to Visit  Medication Dose Route Frequency Provider Last Rate Last Dose  . TDaP (BOOSTRIX) injection 0.5 mL  0.5 mL Intramuscular Once Jackolyn Confer, MD         Review of Systems  Constitutional: Activity change: increased activity.  Respiratory: Negative.  Negative for chest tightness and shortness of breath.   Cardiovascular: Negative.  Negative for chest pain, palpitations and leg swelling.  Psychiatric/Behavioral: Negative.   All other systems reviewed and are negative.       Objective:  BP 116/80  Pulse 80  Temp(Src) 97.7 F (36.5 C) (Oral)  Resp 14  Wt 260 lb (117.935 kg)  SpO2 96%   Physical Exam  Constitutional: She is oriented to person, place, and time.  Pleasant 53 y/o female in NAD  Cardiovascular: Normal rate, regular rhythm, normal heart sounds and intact distal pulses.  Exam reveals no  gallop and no friction rub.   No murmur heard. Pulmonary/Chest: Effort normal and breath sounds normal. No respiratory distress. She has no wheezes. She has no rales.  Musculoskeletal: Normal range of motion.  Neurological: She is alert and oriented to person, place, and time.  Skin: Skin is warm and dry.  Psychiatric: She has a normal mood and affect. Her behavior is normal. Judgment and thought content normal.      Assessment & Plan:   1. Obesity She is doing very well. Continue phentermine daily as directed. Add hand weights while walking. Brisk walk for cardio workout Advised to notify close friends and family that she is trying to lose weight and needs their support. Discussed ways to avoid temptations - Give herself some time when feeling tempted with food.  Reward herself when she meets goal with non food items - ie massage, buy clothing, movie, etc. RTC for follow up in 3 months or sooner if needed.

## 2014-01-20 ENCOUNTER — Other Ambulatory Visit: Payer: Self-pay | Admitting: Internal Medicine

## 2014-01-20 NOTE — Telephone Encounter (Signed)
Ok refill? 

## 2014-07-26 ENCOUNTER — Other Ambulatory Visit: Payer: Self-pay | Admitting: Internal Medicine

## 2014-07-28 ENCOUNTER — Encounter: Payer: Self-pay | Admitting: Internal Medicine

## 2014-07-30 ENCOUNTER — Other Ambulatory Visit: Payer: Self-pay | Admitting: *Deleted

## 2014-07-30 ENCOUNTER — Telehealth: Payer: Self-pay

## 2014-07-30 MED ORDER — MELOXICAM 15 MG PO TABS: 15.0000 mg | ORAL_TABLET | Freq: Every day | ORAL | Status: DC

## 2014-07-30 MED ORDER — LOSARTAN POTASSIUM-HCTZ 50-12.5 MG PO TABS: 1.0000 | ORAL_TABLET | Freq: Every day | ORAL | Status: DC

## 2014-07-30 NOTE — Telephone Encounter (Signed)
The patient called and is hoping to get a refill of her blood pressure medicine and mobic to last until her next apt- she has made an appointment for 08/06/14 with C.Doss.  Callback - [phone]

## 2014-08-03 ENCOUNTER — Other Ambulatory Visit: Payer: Self-pay | Admitting: Internal Medicine

## 2014-08-06 ENCOUNTER — Ambulatory Visit (INDEPENDENT_AMBULATORY_CARE_PROVIDER_SITE_OTHER): Payer: PRIVATE HEALTH INSURANCE | Admitting: Nurse Practitioner

## 2014-08-06 ENCOUNTER — Encounter: Payer: Self-pay | Admitting: Nurse Practitioner

## 2014-08-06 VITALS — BP 118/76 | HR 80 | Temp 98.3°F | Resp 12 | Ht 69.0 in | Wt 261.8 lb

## 2014-08-06 DIAGNOSIS — Z79899 Other long term (current) drug therapy: Secondary | ICD-10-CM

## 2014-08-06 DIAGNOSIS — E669 Obesity, unspecified: Secondary | ICD-10-CM

## 2014-08-06 DIAGNOSIS — I10 Essential (primary) hypertension: Secondary | ICD-10-CM

## 2014-08-06 DIAGNOSIS — M2550 Pain in unspecified joint: Secondary | ICD-10-CM

## 2014-08-06 DIAGNOSIS — Z5181 Encounter for therapeutic drug level monitoring: Secondary | ICD-10-CM

## 2014-08-06 LAB — HEPATIC FUNCTION PANEL
ALT: 17 U/L (ref 0–35)
AST: 20 U/L (ref 0–37)
Albumin: 3.9 g/dL (ref 3.5–5.2)
Alkaline Phosphatase: 102 U/L (ref 39–117)
BILIRUBIN DIRECT: 0.1 mg/dL (ref 0.0–0.3)
Total Bilirubin: 0.7 mg/dL (ref 0.2–1.2)
Total Protein: 7 g/dL (ref 6.0–8.3)

## 2014-08-06 MED ORDER — LOSARTAN POTASSIUM-HCTZ 50-12.5 MG PO TABS: 1.0000 | ORAL_TABLET | Freq: Every day | ORAL | Status: DC

## 2014-08-06 MED ORDER — PHENTERMINE HCL 37.5 MG PO CAPS: 37.5000 mg | ORAL_CAPSULE | ORAL | Status: DC

## 2014-08-06 MED ORDER — MELOXICAM 15 MG PO TABS: 15.0000 mg | ORAL_TABLET | Freq: Every day | ORAL | Status: DC

## 2014-08-06 MED ORDER — TERBINAFINE HCL 250 MG PO TABS: 250.0000 mg | ORAL_TABLET | Freq: Every day | ORAL | Status: DC

## 2014-08-06 NOTE — Addendum Note (Signed)
Addended by: Rubbie Battiest on: 08/06/2014 04:17 PM   Modules accepted: Orders

## 2014-08-06 NOTE — Progress Notes (Signed)
Pre visit review using our clinic review tool, if applicable. No additional management support is needed unless otherwise documented below in the visit note. 

## 2014-08-06 NOTE — Patient Instructions (Addendum)
We refilled your phetermine for 3 more months I have written a prescription for  phentermine for 3 months.    If you have not lost 13 lbs (which is 5% of your starting weight)  by the end of the  3 months, the risks of continuing the medication outweigh the benefits and  we will have to discontinue it and find a Plan B .   Please visit the lab before leaving today to check your liver function before starting Lamisil orally for toe nail fungus.   We will follow up in 3 months to see how the phentermine worked. Keep up the healthy activities, you have worked hard!   Happy Holidays!

## 2014-08-06 NOTE — Assessment & Plan Note (Signed)
BP Readings from Last 3 Encounters:  08/06/14 118/76  11/14/13 116/80  10/01/13 140/82   BP well controlled on Losartan-HCTZ. Refilled today, will follow up in 3 months (visit for obesity and weight loss).

## 2014-08-06 NOTE — Progress Notes (Signed)
Subjective:    Patient ID: Heidi Hicks, female    DOB: 1961-08-10, 53 y.o.   MRN: [id number]  HPI  Heidi Hicks is a 53 yo female with a CC of medication refills.   1)  Would like refill of losartan, mobic, and terbinafine.   2) toe nail fungus recurrent- Both feet, yellow/ thick toe nails. Recurrent, patient only took 1 month last time when seeing some results. She feels this was too soon.   3) Weight loss-  Wt Readings from Last 3 Encounters:  08/06/14 261 lb 12.8 oz (118.752 kg)  11/14/13 260 lb (117.935 kg)  10/01/13 268 lb 12 oz (121.904 kg)   When taking the phentermine she had no side effects, lots of energy, and weight loss of 8 lbs. She is currently up 1 lb from March.   Diet- eating better, substitute smoothies for meals 3 x week.  Exercise- Zumba 3 x week 1 hour  Hasn't taken medication since May.   3) HTN- losartan-HCTZ  takes every day, no side effects, BP checks at [facility] intermittently, staying below 140/90. No cough.   4) Meloxicam- Helps pt a lot, trouble with ankle, makes bearable, Left ankle- broke/ORIF.    Review of Systems  Constitutional: Negative for fever, chills, diaphoresis and fatigue.  Eyes: Negative for visual disturbance.  Respiratory: Negative for chest tightness, shortness of breath and wheezing.   Cardiovascular: Negative for chest pain, palpitations and leg swelling.  Gastrointestinal: Negative for nausea, vomiting, abdominal pain and diarrhea.  Skin: Negative for rash.  Neurological: Negative for dizziness and headaches.  Psychiatric/Behavioral: Negative for agitation. The patient is not nervous/anxious.    Past Medical History  Diagnosis Date  . Arthritis   . Hypertension     History   Social History  . Marital Status: Married    Spouse Name: N/A    Number of Children: N/A  . Years of Education: N/A   Occupational History  . Not on file.   Social History Main Topics  . Smoking status: Never Smoker   . Smokeless tobacco:  Never Used  . Alcohol Use: No  . Drug Use: No  . Sexual Activity: Not on file   Other Topics Concern  . Not on file   Social History Narrative   Lives in [place] with husband and 2 children. Dog in house.   Work: Medical illustrator, Public relations account executive   Diet: regular   Exercise: walks 3-4 times per week    Past Surgical History  Procedure Laterality Date  . Gallbladder surgery  2009  . Tonsillectomy and adenoidectomy  1971  . Broken ankle  1988  . Abkle fusion  1990  . Breast surgery  2002    reduction  . Cesarean section      1981, 1989, 1997    Family History  Problem Relation Age of Onset  . Hypertension Mother   . Diabetes Mother   . Hypertension Father   . Diabetes Father   . Diabetes Sister   . Colon polyps Neg Hx     Allergies  Allergen Reactions  . Ace Inhibitors Cough    No cough or complaints with Losartan.     Current Outpatient Prescriptions on File Prior to Visit  Medication Sig Dispense Refill  . clobetasol cream (TEMOVATE) 5.18 % Apply 1 application topically 2 (two) times daily. 30 g 3   Current Facility-Administered Medications on File Prior to Visit  Medication Dose Route Frequency Provider Last Rate Last Dose  .  TDaP (BOOSTRIX) injection 0.5 mL  0.5 mL Intramuscular Once Jackolyn Confer, MD           Objective:   Physical Exam  Constitutional: She is oriented to person, place, and time. She appears well-developed and well-nourished. No distress.  Obese  HENT:  Head: Normocephalic and atraumatic.  Cardiovascular: Normal rate and regular rhythm.   Musculoskeletal: Normal range of motion.  Neurological: She is alert and oriented to person, place, and time. Coordination normal.  Skin: Skin is warm and dry. No rash noted. She is not diaphoretic.  Psychiatric: She has a normal mood and affect. Her behavior is normal. Judgment and thought content normal.   BP 118/76 mmHg  Pulse 80  Temp(Src) 98.3 F (36.8 C) (Oral)  Resp 12  Ht 5\' 9"  (1.753  m)  Wt 261 lb 12.8 oz (118.752 kg)  BMI 38.64 kg/m2  SpO2 98%     Assessment & Plan:

## 2014-08-06 NOTE — Assessment & Plan Note (Signed)
Still having recurrent toe nail fungal issues. Pt would like to start back on terbinafine 250 mg x 12 weeks. Obtain LFT's today, if okay, will send rx for medication. If not controlled in future pt to see podiatry for laser therapy.

## 2014-08-06 NOTE — Assessment & Plan Note (Addendum)
Starting weight was 266 with Rey, NP. Now 261. BP 118/76. 1 more trial of phentermine x 3 months with diet and exercise. If 5% weight loss is achieved (13 lbs) can consider another month. If not, work on diet and exercise. FU in 3 months

## 2014-08-07 ENCOUNTER — Telehealth: Payer: Self-pay

## 2014-08-07 ENCOUNTER — Encounter: Payer: Self-pay | Admitting: Nurse Practitioner

## 2014-08-07 NOTE — Telephone Encounter (Signed)
This was faxed to the pharmacy yesterday afternoon. Left message for pt, notifying her to check with pharmacy.

## 2014-08-07 NOTE — Telephone Encounter (Signed)
The patient called and is hoping to get a refill of her phentermine. Thanks!

## 2014-09-10 ENCOUNTER — Other Ambulatory Visit: Payer: Self-pay | Admitting: Nurse Practitioner

## 2014-09-10 MED ORDER — PHENTERMINE HCL 37.5 MG PO TABS: 37.5000 mg | ORAL_TABLET | Freq: Every day | ORAL | Status: DC

## 2014-10-26 ENCOUNTER — Other Ambulatory Visit: Payer: Self-pay | Admitting: Nurse Practitioner

## 2014-10-27 ENCOUNTER — Other Ambulatory Visit: Payer: Self-pay | Admitting: Nurse Practitioner

## 2014-10-27 NOTE — Telephone Encounter (Signed)
Ok refill? 

## 2014-10-27 NOTE — Telephone Encounter (Signed)
Last refill 12.16.15.  Please advise refill

## 2014-11-05 ENCOUNTER — Telehealth: Payer: Self-pay

## 2014-11-05 ENCOUNTER — Ambulatory Visit (INDEPENDENT_AMBULATORY_CARE_PROVIDER_SITE_OTHER): Payer: PRIVATE HEALTH INSURANCE | Admitting: Nurse Practitioner

## 2014-11-05 ENCOUNTER — Ambulatory Visit: Payer: PRIVATE HEALTH INSURANCE | Admitting: Nurse Practitioner

## 2014-11-05 DIAGNOSIS — E669 Obesity, unspecified: Secondary | ICD-10-CM

## 2014-11-05 NOTE — Telephone Encounter (Signed)
Called pt to inquire about not showing up for apt, no answer, lvmom.

## 2014-11-05 NOTE — Progress Notes (Signed)
Pre visit review using our clinic review tool, if applicable. No additional management support is needed unless otherwise documented below in the visit note. 

## 2014-11-09 ENCOUNTER — Encounter: Payer: Self-pay | Admitting: Nurse Practitioner

## 2014-12-25 ENCOUNTER — Ambulatory Visit (INDEPENDENT_AMBULATORY_CARE_PROVIDER_SITE_OTHER): Payer: No Typology Code available for payment source | Admitting: Nurse Practitioner

## 2014-12-25 ENCOUNTER — Encounter: Payer: Self-pay | Admitting: Nurse Practitioner

## 2014-12-25 VITALS — BP 126/82 | HR 73 | Temp 97.8°F | Resp 12 | Ht 69.0 in | Wt 257.8 lb

## 2014-12-25 DIAGNOSIS — I10 Essential (primary) hypertension: Secondary | ICD-10-CM | POA: Diagnosis not present

## 2014-12-25 DIAGNOSIS — E669 Obesity, unspecified: Secondary | ICD-10-CM | POA: Diagnosis not present

## 2014-12-25 DIAGNOSIS — Z1329 Encounter for screening for other suspected endocrine disorder: Secondary | ICD-10-CM

## 2014-12-25 DIAGNOSIS — Z79899 Other long term (current) drug therapy: Secondary | ICD-10-CM | POA: Diagnosis not present

## 2014-12-25 DIAGNOSIS — Z131 Encounter for screening for diabetes mellitus: Secondary | ICD-10-CM

## 2014-12-25 LAB — CBC WITH DIFFERENTIAL/PLATELET
BASOS ABS: 0 10*3/uL (ref 0.0–0.1)
Basophils Relative: 0.5 % (ref 0.0–3.0)
EOS ABS: 0.1 10*3/uL (ref 0.0–0.7)
Eosinophils Relative: 1.2 % (ref 0.0–5.0)
HCT: 39.7 % (ref 36.0–46.0)
Hemoglobin: 13.5 g/dL (ref 12.0–15.0)
LYMPHS PCT: 37.8 % (ref 12.0–46.0)
Lymphs Abs: 1.8 10*3/uL (ref 0.7–4.0)
MCHC: 34.1 g/dL (ref 30.0–36.0)
MCV: 84.9 fl (ref 78.0–100.0)
Monocytes Absolute: 0.5 10*3/uL (ref 0.1–1.0)
Monocytes Relative: 11.3 % (ref 3.0–12.0)
NEUTROS PCT: 49.2 % (ref 43.0–77.0)
Neutro Abs: 2.3 10*3/uL (ref 1.4–7.7)
Platelets: 334 10*3/uL (ref 150.0–400.0)
RBC: 4.68 Mil/uL (ref 3.87–5.11)
RDW: 14.7 % (ref 11.5–15.5)
WBC: 4.8 10*3/uL (ref 4.0–10.5)

## 2014-12-25 LAB — BASIC METABOLIC PANEL
BUN: 16 mg/dL (ref 6–23)
CO2: 32 mEq/L (ref 19–32)
CREATININE: 1.04 mg/dL (ref 0.40–1.20)
Calcium: 9.4 mg/dL (ref 8.4–10.5)
Chloride: 101 mEq/L (ref 96–112)
GFR: 71.35 mL/min (ref >60.00)
Glucose, Bld: 86 mg/dL (ref 70–99)
POTASSIUM: 4.7 meq/L (ref 3.5–5.1)
Sodium: 138 mEq/L (ref 135–145)

## 2014-12-25 LAB — HEPATIC FUNCTION PANEL
ALT: 18 U/L (ref 0–35)
AST: 19 U/L (ref 0–37)
Albumin: 4 g/dL (ref 3.5–5.2)
Alkaline Phosphatase: 122 U/L — ABNORMAL HIGH (ref 39–117)
Bilirubin, Direct: 0.1 mg/dL (ref 0.0–0.3)
Total Bilirubin: 0.5 mg/dL (ref 0.2–1.2)
Total Protein: 7.5 g/dL (ref 6.0–8.3)

## 2014-12-25 LAB — HEMOGLOBIN A1C: HEMOGLOBIN A1C: 6.1 % (ref 4.6–6.5)

## 2014-12-25 LAB — TSH: TSH: 1.49 u[IU]/mL (ref 0.35–4.50)

## 2014-12-25 MED ORDER — PHENTERMINE HCL 37.5 MG PO TABS: 37.5000 mg | ORAL_TABLET | Freq: Every day | ORAL | Status: DC

## 2014-12-25 NOTE — Progress Notes (Signed)
   Subjective:    Patient ID: Heidi Hicks, female    DOB: [birth year], 54 y.o.   MRN: [id number]  HPI  Heidi Hicks is a 54 yo female with a CC of medication follow up.   1) Phentermine- Lost 4 lbs, down to 250 lbs after starting then back.   2) Lamisil- Ran out, doing okay, toe nail fungus improved, didn't go away   3) Using cream for psoriasis   Wt Readings from Last 3 Encounters:  12/25/14 257 lb 12.8 oz (116.937 kg)  08/06/14 261 lb 12.8 oz (118.752 kg)  11/14/13 260 lb (117.935 kg)    Review of Systems  Constitutional: Negative for fever, chills, diaphoresis and fatigue.  Respiratory: Negative for chest tightness, shortness of breath and wheezing.   Cardiovascular: Negative for chest pain, palpitations and leg swelling.  Gastrointestinal: Negative for nausea, vomiting and diarrhea.  Skin: Negative for rash.  Neurological: Negative for dizziness, weakness, numbness and headaches.  Psychiatric/Behavioral: The patient is not nervous/anxious.        Objective:   Physical Exam  Constitutional: She is oriented to person, place, and time. She appears well-developed and well-nourished. No distress.  BP 126/82 mmHg  Pulse 73  Temp(Src) 97.8 F (36.6 C) (Oral)  Resp 12  Ht 5\' 9"  (1.753 m)  Wt 257 lb 12.8 oz (116.937 kg)  BMI 38.05 kg/m2  SpO2 99%   HENT:  Head: Normocephalic and atraumatic.  Right Ear: External ear normal.  Left Ear: External ear normal.  Cardiovascular: Normal rate, regular rhythm, normal heart sounds and intact distal pulses.  Exam reveals no gallop and no friction rub.   No murmur heard. Pulmonary/Chest: Effort normal and breath sounds normal. No respiratory distress. She has no wheezes. She has no rales. She exhibits no tenderness.  Neurological: She is alert and oriented to person, place, and time. No cranial nerve deficit. She exhibits normal muscle tone. Coordination normal.  Skin: Skin is warm and dry. No rash noted. She is not diaphoretic.    Still thick toenails with yellow coloring  Psychiatric: She has a normal mood and affect. Her behavior is normal. Judgment and thought content normal.       Assessment & Plan:

## 2014-12-25 NOTE — Progress Notes (Signed)
Pre visit review using our clinic review tool, if applicable. No additional management support is needed unless otherwise documented below in the visit note. 

## 2014-12-25 NOTE — Patient Instructions (Signed)
Please visit the lab before leaving today.   We will call in your lamisil if your liver test looks good.   Follow up in 3 months.

## 2014-12-26 ENCOUNTER — Other Ambulatory Visit: Payer: Self-pay | Admitting: Nurse Practitioner

## 2014-12-26 MED ORDER — TERBINAFINE HCL 250 MG PO TABS: 250.0000 mg | ORAL_TABLET | Freq: Every day | ORAL | Status: DC

## 2015-01-03 NOTE — Assessment & Plan Note (Signed)
Refilled Lamisil, checking HFP, gave phentermine for weight loss.

## 2015-01-03 NOTE — Assessment & Plan Note (Signed)
Will try phentermine. Discussed risks/benefits. Will follow up in 3 months. Check BMET, CBC w/ diff, HFP, A1c, and TSH.

## 2015-01-24 ENCOUNTER — Other Ambulatory Visit: Payer: Self-pay | Admitting: Nurse Practitioner

## 2015-01-25 ENCOUNTER — Other Ambulatory Visit: Payer: Self-pay | Admitting: Nurse Practitioner

## 2015-01-26 NOTE — Telephone Encounter (Signed)
Ok refill? 

## 2015-02-25 ENCOUNTER — Other Ambulatory Visit: Payer: Self-pay | Admitting: Nurse Practitioner

## 2015-02-25 NOTE — Telephone Encounter (Signed)
Refill

## 2015-03-27 ENCOUNTER — Ambulatory Visit: Payer: No Typology Code available for payment source | Admitting: Internal Medicine

## 2015-03-27 DIAGNOSIS — Z0289 Encounter for other administrative examinations: Secondary | ICD-10-CM

## 2015-04-13 ENCOUNTER — Other Ambulatory Visit: Payer: Self-pay | Admitting: Nurse Practitioner

## 2015-04-13 NOTE — Telephone Encounter (Signed)
Last OV 5.5.16.  Please advise refill

## 2015-04-22 ENCOUNTER — Other Ambulatory Visit: Payer: Self-pay | Admitting: Internal Medicine

## 2015-04-22 NOTE — Telephone Encounter (Signed)
Last OV 5.5.16.  Please advise refill 

## 2015-07-14 ENCOUNTER — Other Ambulatory Visit: Payer: Self-pay

## 2015-07-14 MED ORDER — CLOBETASOL PROPIONATE 0.05 % EX CREA: 1.0000 "application " | TOPICAL_CREAM | Freq: Two times a day (BID) | CUTANEOUS | Status: DC

## 2015-08-04 ENCOUNTER — Encounter: Payer: Self-pay | Admitting: Nurse Practitioner

## 2015-08-04 ENCOUNTER — Ambulatory Visit (INDEPENDENT_AMBULATORY_CARE_PROVIDER_SITE_OTHER): Payer: No Typology Code available for payment source | Admitting: Nurse Practitioner

## 2015-08-04 VITALS — BP 124/60 | HR 84 | Temp 98.2°F | Wt 270.0 lb

## 2015-08-04 DIAGNOSIS — J0110 Acute frontal sinusitis, unspecified: Secondary | ICD-10-CM | POA: Diagnosis not present

## 2015-08-04 MED ORDER — GUAIFENESIN-CODEINE 100-10 MG/5ML PO SYRP: 5.0000 mL | ORAL_SOLUTION | Freq: Every day | ORAL | Status: DC

## 2015-08-04 MED ORDER — PREDNISONE 10 MG PO TABS: ORAL_TABLET | ORAL | Status: DC

## 2015-08-04 MED ORDER — AMOXICILLIN-POT CLAVULANATE 875-125 MG PO TABS: 1.0000 | ORAL_TABLET | Freq: Two times a day (BID) | ORAL | Status: DC

## 2015-08-04 NOTE — Patient Instructions (Signed)
Prednisone with breakfast or lunch at the latest.  6 tablets on day 1, 5 tablets on day 2, 4 tablets on day 3, 3 tablets on day 4, 2 tablets day 5, 1 tablet on day 6...done! Take tablets all together not spaced out Don't take with NSAIDs (Ibuprofen, Aleve, Naproxen, Meloxicam ect...)  5 mL (1 teaspoon) of cough syrup. Don't drive or make important decisions while taking this....just sleep and rest.   Rest for the next few days.

## 2015-08-04 NOTE — Progress Notes (Signed)
Patient ID: Heidi Hicks, female    DOB: 21-Oct-1960  Age: 54 y.o. MRN: [id number]  CC: Nasal Congestion   HPI Siriyah L Liebrecht presents for Nasal congestion x 3 weeks.   1) Pt reports 3 weeks of sore throat, headaches, productive cough. Pt reports worsening of symptoms over the last few days.  Treatment to date: OTC measures with minimal relief Sick contacts- denies   History Heidi Hicks has a past medical history of Arthritis and Hypertension.   She has past surgical history that includes Gallbladder surgery (2009); Tonsillectomy and adenoidectomy (1971); broken ankle (1988); abkle fusion (1990); Breast surgery (2002); and Cesarean section.   Her family history includes Diabetes in her father, mother, and sister; Hypertension in her father and mother. There is no history of Colon polyps.She reports that she has never smoked. She has never used smokeless tobacco. She reports that she does not drink alcohol or use illicit drugs.  Outpatient Prescriptions Prior to Visit  Medication Sig Dispense Refill  . clobetasol cream (TEMOVATE) [id number] % Apply 1 application topically 2 (two) times daily. 30 g 3  . phentermine (ADIPEX-P) 37.5 MG tablet Take 1 tablet (37.5 mg total) by mouth daily before breakfast. 30 tablet 2  . terbinafine (LAMISIL) 250 MG tablet Take 1 tablet (250 mg total) by mouth daily. 84 tablet 0  . losartan-hydrochlorothiazide (HYZAAR) 50-12.5 MG per tablet TAKE 1 TABLET BY MOUTH DAILY. 30 tablet 5  . meloxicam (MOBIC) 15 MG tablet TAKE 1 TABLET BY MOUTH EVERY DAY 30 tablet 2   Facility-Administered Medications Prior to Visit  Medication Dose Route Frequency Provider Last Rate Last Dose  . TDaP (BOOSTRIX) injection 0.5 mL  0.5 mL Intramuscular Once Jackolyn Confer, MD        ROS Review of Systems  Constitutional: Positive for fatigue. Negative for fever, chills and diaphoresis.  HENT: Positive for congestion, postnasal drip, rhinorrhea and sore throat. Negative for ear  discharge and ear pain.   Eyes: Negative for visual disturbance.  Respiratory: Positive for cough. Negative for chest tightness, shortness of breath and wheezing.   Cardiovascular: Negative for chest pain, palpitations and leg swelling.  Gastrointestinal: Negative for nausea, vomiting and diarrhea.  Neurological: Positive for headaches.    Objective:  BP 124/60 mmHg  Pulse 84  Temp(Src) 98.2 F (36.8 C) (Oral)  Wt 270 lb (122.471 kg)  SpO2 97%  Physical Exam  Constitutional: She is oriented to person, place, and time. She appears well-developed and well-nourished. No distress.  HENT:  Head: Normocephalic and atraumatic.  Right Ear: External ear normal.  Left Ear: External ear normal.  Mouth/Throat: No oropharyngeal exudate.  TM's clear bilaterally  Eyes: EOM are normal. Pupils are equal, round, and reactive to light. Right eye exhibits no discharge. Left eye exhibits no discharge. No scleral icterus.  Neck: Normal range of motion. Neck supple.  Cardiovascular: Normal rate, regular rhythm and normal heart sounds.  Exam reveals no gallop and no friction rub.   No murmur heard. Pulmonary/Chest: Effort normal and breath sounds normal. No respiratory distress. She has no wheezes. She has no rales. She exhibits no tenderness.  Lymphadenopathy:    She has cervical adenopathy.  Neurological: She is alert and oriented to person, place, and time. No cranial nerve deficit. She exhibits normal muscle tone. Coordination normal.  Skin: Skin is warm and dry. No rash noted. She is not diaphoretic.  Psychiatric: She has a normal mood and affect. Her behavior is normal. Judgment and thought content  normal.   Assessment & Plan:   [name] was seen today for nasal congestion.  Diagnoses and all orders for this visit:  Acute frontal sinusitis, recurrence not specified  Other orders -     amoxicillin-clavulanate (AUGMENTIN) 875-125 MG tablet; Take 1 tablet by mouth 2 (two) times daily. -      predniSONE (DELTASONE) 10 MG tablet; Take 6 tablets by mouth on day 1 with breakfast then decrease by 1 tablet each day until gone. -     guaiFENesin-codeine (ROBITUSSIN AC) 100-10 MG/5ML syrup; Take 5 mLs by mouth at bedtime.   I am having Ms. Martorelli start on amoxicillin-clavulanate, predniSONE, and guaiFENesin-codeine. I am also having her maintain her phentermine, terbinafine, and clobetasol cream. We will continue to administer Tdap.  Meds ordered this encounter  Medications  . amoxicillin-clavulanate (AUGMENTIN) 875-125 MG tablet    Sig: Take 1 tablet by mouth 2 (two) times daily.    Dispense:  20 tablet    Refill:  0    Order Specific Question:  Supervising Provider    Answer:  Deborra Medina L [[id number]]  . predniSONE (DELTASONE) 10 MG tablet    Sig: Take 6 tablets by mouth on day 1 with breakfast then decrease by 1 tablet each day until gone.    Dispense:  21 tablet    Refill:  0    Order Specific Question:  Supervising Provider    Answer:  Deborra Medina L [[id number]]  . guaiFENesin-codeine (ROBITUSSIN AC) 100-10 MG/5ML syrup    Sig: Take 5 mLs by mouth at bedtime.    Dispense:  240 mL    Refill:  0    Order Specific Question:  Supervising Provider    Answer:  Crecencio Mc [[id number]]     Follow-up: Return if symptoms worsen or fail to improve.

## 2015-08-04 NOTE — Progress Notes (Signed)
Pre visit review using our clinic review tool, if applicable. No additional management support is needed unless otherwise documented below in the visit note. 

## 2015-08-06 ENCOUNTER — Telehealth: Payer: Self-pay | Admitting: Nurse Practitioner

## 2015-08-06 NOTE — Telephone Encounter (Signed)
LMTCB if not feeling better, if she is she does not have to return the call.

## 2015-08-09 ENCOUNTER — Other Ambulatory Visit: Payer: Self-pay | Admitting: Internal Medicine

## 2015-08-11 DIAGNOSIS — J0110 Acute frontal sinusitis, unspecified: Secondary | ICD-10-CM | POA: Insufficient documentation

## 2015-08-11 NOTE — Assessment & Plan Note (Signed)
Due to severity of symptoms length of concern will treat with Augmentin twice daily 10 days.  Robitussin-AC printed signed, and given to patient to take to pharmacy.  Prednisone taper given as well with instructions verbal and on AVS FU prn worsening/failure to improve.

## 2015-11-06 ENCOUNTER — Other Ambulatory Visit: Payer: Self-pay | Admitting: Internal Medicine

## 2015-11-06 NOTE — Telephone Encounter (Signed)
Pt has not seen Dr Gilford Rile since 2014, Pt has seen you multiple times over the past year or more... Changing PCP to you.  Okay to refill?

## 2015-12-06 ENCOUNTER — Other Ambulatory Visit: Payer: Self-pay | Admitting: Nurse Practitioner

## 2015-12-07 ENCOUNTER — Encounter: Payer: Self-pay | Admitting: Family Medicine

## 2015-12-07 ENCOUNTER — Ambulatory Visit (INDEPENDENT_AMBULATORY_CARE_PROVIDER_SITE_OTHER): Payer: Managed Care, Other (non HMO) | Admitting: Family Medicine

## 2015-12-07 VITALS — BP 130/74 | HR 88 | Temp 98.7°F | Ht 69.0 in | Wt 265.4 lb

## 2015-12-07 DIAGNOSIS — J209 Acute bronchitis, unspecified: Secondary | ICD-10-CM | POA: Diagnosis not present

## 2015-12-07 MED ORDER — DOXYCYCLINE HYCLATE 100 MG PO TABS: 100.0000 mg | ORAL_TABLET | Freq: Two times a day (BID) | ORAL | Status: DC

## 2015-12-07 MED ORDER — PREDNISONE 50 MG PO TABS: ORAL_TABLET | ORAL | Status: DC

## 2015-12-07 MED ORDER — HYDROCOD POLST-CPM POLST ER 10-8 MG/5ML PO SUER: 5.0000 mL | Freq: Two times a day (BID) | ORAL | Status: DC | PRN

## 2015-12-07 NOTE — Telephone Encounter (Signed)
Refilled 11/06/15.  Last seen in 07/2015, but not for this medication. Please advise?

## 2015-12-07 NOTE — Progress Notes (Signed)
Subjective:  Patient ID: Heidi Hicks, female    DOB: February 04, 1961  Age: 55 y.o. MRN: [id number]  CC: ST, Cough, Body aches  HPI:  55 year old female presents with the above complaints.  Patient states that she's not been feeling well since Friday. She has been experiencing sore throat and mildly productive cough. She's also had associated body aches and headache. Cough is the predominant symptom currently. No associated fevers or chills. She's taken some over-the-counter Robitussin and Mucinex with no improvement. No known exacerbating factors. No other complaints this time  Social Hx   Social History   Social History  . Marital Status: Married    Spouse Name: N/A  . Number of Children: N/A  . Years of Education: N/A   Social History Main Topics  . Smoking status: Never Smoker   . Smokeless tobacco: Never Used  . Alcohol Use: No  . Drug Use: No  . Sexual Activity: Not Asked   Other Topics Concern  . None   Social History Narrative   Lives in [place] with husband and 2 children. Dog in house.   Work: Medical illustrator, [employer]   Diet: regular   Exercise: walks 3-4 times per week   Review of Systems  Constitutional: Positive for fatigue. Negative for fever.  HENT: Positive for sore throat.   Respiratory: Positive for cough.   Musculoskeletal:       Body aches.  Neurological: Positive for headaches.   Objective:  BP 130/74 mmHg  Pulse 88  Temp(Src) 98.7 F (37.1 C) (Oral)  Ht 5\' 9"  (1.753 m)  Wt 265 lb 6 oz (120.373 kg)  BMI 39.17 kg/m2  SpO2 94%  BP/Weight 12/07/2015 [id number]  Systolic BP [id number]  Diastolic BP 74 60 82  Wt. (Lbs) 265.38 270 257.8  BMI 39.17 39.85 38.05    Physical Exam  Constitutional: She is oriented to person, place, and time. She appears well-developed. No distress.  HENT:  Mouth/Throat: Oropharynx is clear and moist.  Normal TM's bilaterally.   Eyes: Conjunctivae are normal. No scleral icterus.  Neck: Neck  supple.  Cardiovascular: Normal rate and regular rhythm.   Pulmonary/Chest: Effort normal and breath sounds normal. She has no wheezes. She has no rales.  Neurological: She is alert and oriented to person, place, and time.  Psychiatric:  Flat affect.   Vitals reviewed.  Lab Results  Component Value Date   WBC 4.8 12/25/2014   HGB 13.5 12/25/2014   HCT 39.7 12/25/2014   PLT 334.0 12/25/2014   GLUCOSE 86 12/25/2014   CHOL 170 02/04/2013   TRIG 120.0 02/04/2013   HDL 45.40 02/04/2013   LDLCALC 101* 02/04/2013   ALT 18 12/25/2014   AST 19 12/25/2014   NA 138 12/25/2014   K 4.7 12/25/2014   CL 101 12/25/2014   CREATININE 1.04 12/25/2014   BUN 16 12/25/2014   CO2 32 12/25/2014   TSH 1.49 12/25/2014   HGBA1C 6.1 12/25/2014   MICROALBUR 3.6* 02/04/2013   Assessment & Plan:   Problem List Items Addressed This Visit    Acute bronchitis - Primary    New acute problem. Exam unremarkable. History consistent with viral infection. Treating with prednisone and Tussionex. Doxycycline to be filled if she fails to improve or worsens.      Relevant Orders   POCT Influenza A/B      Meds ordered this encounter  Medications  . predniSONE (DELTASONE) 50 MG tablet    Sig: 1 tablet daily  x 5 days.    Dispense:  5 tablet    Refill:  0  . doxycycline (VIBRA-TABS) 100 MG tablet    Sig: Take 1 tablet (100 mg total) by mouth 2 (two) times daily.    Dispense:  14 tablet    Refill:  0  . chlorpheniramine-HYDROcodone (TUSSIONEX PENNKINETIC ER) 10-8 MG/5ML SUER    Sig: Take 5 mLs by mouth every 12 (twelve) hours as needed.    Dispense:  115 mL    Refill:  0    Follow-up: PRN  [place]

## 2015-12-07 NOTE — Progress Notes (Signed)
Pre visit review using our clinic review tool, if applicable. No additional management support is needed unless otherwise documented below in the visit note. 

## 2015-12-07 NOTE — Patient Instructions (Signed)
Your flu test was negative.  This is likely a viral illness.  Take the prednisone as prescribed.  Take the antibiotic if you fail to improve or worsen.  Take care  Dr. Lacinda Axon

## 2015-12-07 NOTE — Assessment & Plan Note (Signed)
New acute problem. Exam unremarkable. History consistent with viral infection. Treating with prednisone and Tussionex. Doxycycline to be filled if she fails to improve or worsens.

## 2015-12-08 ENCOUNTER — Telehealth: Payer: Self-pay | Admitting: *Deleted

## 2015-12-08 NOTE — Telephone Encounter (Signed)
Patient was called and todl that a new letter was put up front for her.

## 2015-12-08 NOTE — Telephone Encounter (Signed)
PAtient was seen by Dr. Lacinda Axon Please advise for a new note for work. thanks

## 2015-12-08 NOTE — Telephone Encounter (Signed)
Patient was seen in the office on 12/07/15, she was given a note to return back to work today, however she could not return due to not feeling well, and would like another work note to return to work on 12/09/15. Pt contact [phone]

## 2016-01-02 ENCOUNTER — Other Ambulatory Visit: Payer: Self-pay | Admitting: Internal Medicine

## 2016-01-20 ENCOUNTER — Other Ambulatory Visit: Payer: Self-pay | Admitting: Internal Medicine

## 2016-02-01 ENCOUNTER — Other Ambulatory Visit: Payer: Self-pay | Admitting: Family Medicine

## 2016-02-01 NOTE — Telephone Encounter (Signed)
Last seen on 12/07/15 by Dr.Cook. No upcoming appointments listed. Please advise?

## 2016-04-03 ENCOUNTER — Other Ambulatory Visit: Payer: Self-pay | Admitting: Family Medicine

## 2016-04-04 ENCOUNTER — Other Ambulatory Visit: Payer: Self-pay

## 2016-04-04 MED ORDER — MELOXICAM 15 MG PO TABS: 15.0000 mg | ORAL_TABLET | Freq: Every day | ORAL | 2 refills | Status: DC

## 2016-04-04 NOTE — Telephone Encounter (Signed)
Medication refilled on 01/2016. Patient was last seen by Dr.Cook for an acute issue 12/07/2015. Please advise?

## 2016-04-04 NOTE — Telephone Encounter (Signed)
Please advise for refill, no follow up on file. thanks

## 2016-05-03 ENCOUNTER — Telehealth: Payer: Self-pay | Admitting: Nurse Practitioner

## 2016-05-03 ENCOUNTER — Other Ambulatory Visit: Payer: Self-pay | Admitting: Family Medicine

## 2016-05-03 NOTE — Telephone Encounter (Signed)
Pt is wondering if she needs an appt with [facility] in order to get her losartan-hydrochlorothiazide (HYZAAR) 50-12.5 MG tablet refilled.

## 2016-05-03 NOTE — Telephone Encounter (Signed)
Yes as her last visit was an acute.  Needs to establish with arnett for the refill, thanks

## 2016-05-03 NOTE — Telephone Encounter (Signed)
Pt will est. Care with [facility]. Appt on 05/05/16

## 2016-05-05 ENCOUNTER — Ambulatory Visit (INDEPENDENT_AMBULATORY_CARE_PROVIDER_SITE_OTHER): Payer: Managed Care, Other (non HMO) | Admitting: Family

## 2016-05-05 ENCOUNTER — Encounter: Payer: Self-pay | Admitting: Family

## 2016-05-05 VITALS — BP 118/72 | HR 84 | Temp 97.7°F | Ht 69.0 in | Wt 276.0 lb

## 2016-05-05 DIAGNOSIS — E663 Overweight: Secondary | ICD-10-CM

## 2016-05-05 DIAGNOSIS — E669 Obesity, unspecified: Secondary | ICD-10-CM | POA: Diagnosis not present

## 2016-05-05 DIAGNOSIS — I10 Essential (primary) hypertension: Secondary | ICD-10-CM

## 2016-05-05 DIAGNOSIS — M25572 Pain in left ankle and joints of left foot: Secondary | ICD-10-CM | POA: Insufficient documentation

## 2016-05-05 LAB — COMPREHENSIVE METABOLIC PANEL
ALT: 16 U/L (ref 0–35)
AST: 14 U/L (ref 0–37)
Albumin: 3.9 g/dL (ref 3.5–5.2)
Alkaline Phosphatase: 101 U/L (ref 39–117)
BILIRUBIN TOTAL: 0.3 mg/dL (ref 0.2–1.2)
BUN: 17 mg/dL (ref 6–23)
CALCIUM: 8.6 mg/dL (ref 8.4–10.5)
CHLORIDE: 103 meq/L (ref 96–112)
CO2: 29 meq/L (ref 19–32)
Creatinine, Ser: 0.93 mg/dL (ref 0.40–1.20)
GFR: 80.76 mL/min (ref >60.00)
Glucose, Bld: 89 mg/dL (ref 70–99)
POTASSIUM: 3.7 meq/L (ref 3.5–5.1)
Sodium: 138 mEq/L (ref 135–145)
Total Protein: 7.2 g/dL (ref 6.0–8.3)

## 2016-05-05 MED ORDER — LOSARTAN POTASSIUM-HCTZ 50-12.5 MG PO TABS: 1.0000 | ORAL_TABLET | Freq: Every day | ORAL | 3 refills | Status: DC

## 2016-05-05 MED ORDER — CLOBETASOL PROPIONATE 0.05 % EX CREA: 1.0000 "application " | TOPICAL_CREAM | Freq: Two times a day (BID) | CUTANEOUS | 3 refills | Status: DC

## 2016-05-05 MED ORDER — MELOXICAM 15 MG PO TABS: 15.0000 mg | ORAL_TABLET | Freq: Every day | ORAL | 2 refills | Status: DC

## 2016-05-05 MED ORDER — OMEPRAZOLE 20 MG PO CPDR: 20.0000 mg | DELAYED_RELEASE_CAPSULE | Freq: Every day | ORAL | 3 refills | Status: DC

## 2016-05-05 NOTE — Progress Notes (Signed)
Pre visit review using our clinic review tool, if applicable. No additional management support is needed unless otherwise documented below in the visit note. 

## 2016-05-05 NOTE — Assessment & Plan Note (Signed)
Stable  Continue current regimen  

## 2016-05-05 NOTE — Assessment & Plan Note (Signed)
Stable. Continue current regimen. Added a PPI regimen as a long-term anti-inflammatory use.

## 2016-05-05 NOTE — Progress Notes (Signed)
Subjective:    Patient ID: Heidi Hicks, female    DOB: 05-28-1961, 55 y.o.   MRN: [id number]  CC: Heidi Hicks is a 55 y.o. female who presents today for follow up.   HPI: Patient presents for follow-up chronic disease.  Hypertension-patient is on Hyzaar and compliant with medication. Denies exertional chest pain or pressure, numbness or tingling radiating to left arm or jaw, palpitations, dizziness, frequent headaches, changes in vision, or shortness of breath.   Weight- Frustrated by weight. zumba 3x/week. Weight better controlled when walking. Gained 6 pounds in last year. Vegetarian and eating more pasta. 'Sugar addiction'. No depression or anxiety.   Left ankle pain-decades ago broke ankle and now has hardware. Pain well-controlled on meloxicam.  Mammogram 2014 category 2. Due for mammogram this year. Had a normal mammogram 2016. Pap normal, done 2016. Dr. Nira Retort, OB GYN/ [facility].     HISTORY:  Past Medical History:  Diagnosis Date  . Arthritis   . Hypertension    Past Surgical History:  Procedure Laterality Date  . abkle fusion  1990  . BREAST SURGERY  2002   reduction  . broken ankle  1988  . [place]  . GALLBLADDER SURGERY  2009  . TONSILLECTOMY AND ADENOIDECTOMY  1971   Family History  Problem Relation Age of Onset  . Hypertension Mother   . Diabetes Mother   . Renal cancer Mother 43  . Hypertension Father   . Diabetes Father   . Diabetes Sister   . Colon polyps Neg Hx     Allergies: Ace inhibitors Current Outpatient Prescriptions on File Prior to Visit  Medication Sig Dispense Refill  . terbinafine (LAMISIL) 250 MG tablet Take 1 tablet (250 mg total) by mouth daily. 84 tablet 0   No current facility-administered medications on file prior to visit.     Social History  Substance Use Topics  . Smoking status: Never Smoker  . Smokeless tobacco: Never Used  . Alcohol use No    Review of Systems  Constitutional:  Negative for chills and fever.  Respiratory: Negative for cough.   Cardiovascular: Negative for chest pain and palpitations.  Gastrointestinal: Negative for nausea and vomiting.  Musculoskeletal: Negative for joint swelling.  Psychiatric/Behavioral: The patient is not nervous/anxious.       Objective:    BP 118/72   Pulse 84   Temp 97.7 F (36.5 C) (Oral)   Ht 5\' 9"  (1.753 m)   Wt 276 lb (125.2 kg)   SpO2 98%   BMI 40.76 kg/m  BP Readings from Last 3 Encounters:  05/05/16 118/72  12/07/15 130/74  08/04/15 124/60   Wt Readings from Last 3 Encounters:  05/05/16 276 lb (125.2 kg)  12/07/15 265 lb 6 oz (120.4 kg)  08/04/15 270 lb (122.5 kg)    Physical Exam  Constitutional: She appears well-developed and well-nourished.  Eyes: Conjunctivae are normal.  Cardiovascular: Normal rate, regular rhythm, normal heart sounds and normal pulses.   Pulmonary/Chest: Effort normal and breath sounds normal. She has no wheezes. She has no rhonchi. She has no rales.  Neurological: She is alert.  Skin: Skin is warm and dry.  Psychiatric: She has a normal mood and affect. Her speech is normal and behavior is normal. Thought content normal.  Vitals reviewed.      Assessment & Plan:   Problem List Items Addressed This Visit      Cardiovascular and Mediastinum  Hypertension - Primary    Stable. Continue current regimen.      Relevant Medications   losartan-hydrochlorothiazide (HYZAAR) 50-12.5 MG tablet   clobetasol cream (TEMOVATE) 0.05 %   Other Relevant Orders   Flu Vaccine QUAD 36+ mos IM (Completed)   Comprehensive metabolic panel     Other   Obesity    Worsening. Information given regarding weight loss clinics. Encouraged patient to see a nutritionist and a referral was placed.      Relevant Orders   Ambulatory referral to diabetic education   Left ankle pain    Stable. Continue current regimen. Added a PPI regimen as a long-term anti-inflammatory use.      Relevant  Medications   omeprazole (PRILOSEC) 20 MG capsule   meloxicam (MOBIC) 15 MG tablet    Other Visit Diagnoses   None.      I have discontinued Ms. Pacholski's phentermine, amoxicillin-clavulanate, predniSONE, guaiFENesin-codeine, predniSONE, doxycycline, and chlorpheniramine-HYDROcodone. I am also having her start on omeprazole. Additionally, I am having her maintain her terbinafine, losartan-hydrochlorothiazide, clobetasol cream, and meloxicam. We will stop administering Tdap.   Meds ordered this encounter  Medications  . losartan-hydrochlorothiazide (HYZAAR) 50-12.5 MG tablet    Sig: Take 1 tablet by mouth daily.    Dispense:  90 tablet    Refill:  3    Needs to schedule a appointment with a new PCP to get anymore refills!    Order Specific Question:   Supervising Provider    Answer:   Crecencio Mc [[id number]]  . clobetasol cream (TEMOVATE) 0.05 %    Sig: Apply 1 application topically 2 (two) times daily.    Dispense:  30 g    Refill:  3    Order Specific Question:   Supervising Provider    Answer:   Derrel Nip, TERESA L [[id number]]  . omeprazole (PRILOSEC) 20 MG capsule    Sig: Take 1 capsule (20 mg total) by mouth daily.    Dispense:  30 capsule    Refill:  3    Order Specific Question:   Supervising Provider    Answer:   Deborra Medina L [[id number]]  . meloxicam (MOBIC) 15 MG tablet    Sig: Take 1 tablet (15 mg total) by mouth daily.    Dispense:  30 tablet    Refill:  2    Order Specific Question:   Supervising Provider    Answer:   Crecencio Mc [[id number]]    Return precautions given.   Risks, benefits, and alternatives of the medications and treatment plan prescribed today were discussed, and patient expressed understanding.   Education regarding symptom management and diagnosis given to patient on AVS.  Continue to follow with Rubbie Battiest, NP for routine health maintenance.   Heidi Hicks and I agreed with plan.   Mable Paris, FNP

## 2016-05-05 NOTE — Assessment & Plan Note (Addendum)
Worsening. Information given regarding weight loss clinics. Encouraged patient to see a nutritionist and a referral was placed.

## 2016-05-05 NOTE — Patient Instructions (Signed)
As we discussed, weight loss clinics to consider.   Surgical and NON Surgical options:   [facility]   [place], [place] [email or web address]   You do NOT need a referral to meet with them; however you have to do a free seminar prior to first appointment.   [facility]  [place], [place] [phone]  [phone] [email or web address] No referral needed.  ** My preferred clinic**   Surgical options.   [facility] through [place] seminar in [place];  for more   information, call the [employer] at [phone]. You may also find more information at [email or web address] for FREE weight loss surgery seminar.  A referral is needed for direct consult for surgery; however I suggest a free seminar FIRST to learn about options.  GOOOD LUCK!   Calorie Counting for Weight Loss Calories are energy you get from the things you eat and drink. Your body uses this energy to keep you going throughout the day. The number of calories you eat affects your weight. When you eat more calories than your body needs, your body stores the extra calories as fat. When you eat fewer calories than your body needs, your body burns fat to get the energy it needs. Calorie counting means keeping track of how many calories you eat and drink each day. If you make sure to eat fewer calories than your body needs, you should lose weight. In order for calorie counting to work, you will need to eat the number of calories that are right for you in a day to lose a healthy amount of weight per week. A healthy amount of weight to lose per week is usually 1-2 lb (0.5-0.9 kg). A dietitian can determine how many calories you need in a day and give you suggestions on how to reach your calorie goal.  WHAT IS MY MY PLAN? My goal is to have 1800  calories per day.  If I  have this many calories per day, I should lose around 1-2 pounds per week. WHAT DO I NEED TO KNOW ABOUT CALORIE COUNTING? In order to meet your daily calorie goal, you will need to:  Find out how many calories are in each food you would like to eat. Try to do this before you eat.  Decide how much of the food you can eat.  Write down what you ate and how many calories it had. Doing this is called keeping a food log. WHERE DO I FIND CALORIE INFORMATION? The number of calories in a food can be found on a Nutrition Facts label. Note that all the information on a label is based on a specific serving of the food. If a food does not have a Nutrition Facts label, try to look up the calories online or ask your dietitian for help. HOW DO I DECIDE HOW MUCH TO EAT? To decide how much of the food you can eat, you will need to consider both the number of calories in one serving and the size of one serving. This information can be found on the Nutrition Facts label. If a food does not have a Nutrition Facts label, look up the information online or ask your dietitian for help. Remember that calories are listed per serving. If you choose to have more than one serving of a food, you will have to multiply the calories per serving by the amount of servings you plan to  eat. For example, the label on a package of bread might say that a serving size is 1 slice and that there are 90 calories in a serving. If you eat 1 slice, you will have eaten 90 calories. If you eat 2 slices, you will have eaten 180 calories. HOW DO I KEEP A FOOD LOG? After each meal, record the following information in your food log:  What you ate.  How much of it you ate.  How many calories it had.  Then, add up your calories. Keep your food log near you, such as in a small notebook in your pocket. Another option is to use a mobile app or website. Some programs will calculate calories for you and show you how many calories you have left each  time you add an item to the log. WHAT ARE SOME CALORIE COUNTING TIPS?  Use your calories on foods and drinks that will fill you up and not leave you hungry. Some examples of this include foods like nuts and nut butters, vegetables, lean proteins, and high-fiber foods (more than 5 g fiber per serving).  Eat nutritious foods and avoid empty calories. Empty calories are calories you get from foods or beverages that do not have many nutrients, such as candy and soda. It is better to have a nutritious high-calorie food (such as an avocado) than a food with few nutrients (such as a bag of chips).  Know how many calories are in the foods you eat most often. This way, you do not have to look up how many calories they have each time you eat them.  Look out for foods that may seem like low-calorie foods but are really high-calorie foods, such as baked goods, soda, and fat-free candy.  Pay attention to calories in drinks. Drinks such as sodas, specialty coffee drinks, alcohol, and juices have a lot of calories yet do not fill you up. Choose low-calorie drinks like water and diet drinks.  Focus your calorie counting efforts on higher calorie items. Logging the calories in a garden salad that contains only vegetables is less important than calculating the calories in a milk shake.  Find a way of tracking calories that works for you. Get creative. Most people who are successful find ways to keep track of how much they eat in a day, even if they do not count every calorie. WHAT ARE SOME PORTION CONTROL TIPS?  Know how many calories are in a serving. This will help you know how many servings of a certain food you can have.  Use a measuring cup to measure serving sizes. This is helpful when you start out. With time, you will be able to estimate serving sizes for some foods.  Take some time to put servings of different foods on your favorite plates, bowls, and cups so you know what a serving looks like.  Try  not to eat straight from a bag or box. Doing this can lead to overeating. Put the amount you would like to eat in a cup or on a plate to make sure you are eating the right portion.  Use smaller plates, glasses, and bowls to prevent overeating. This is a quick and easy way to practice portion control. If your plate is smaller, less food can fit on it.  Try not to multitask while eating, such as watching TV or using your computer. If it is time to eat, sit down at a table and enjoy your food. Doing this will help you  to start recognizing when you are full. It will also make you more aware of what and how much you are eating. HOW CAN I CALORIE COUNT WHEN EATING OUT?  Ask for smaller portion sizes or child-sized portions.  Consider sharing an entree and sides instead of getting your own entree.  If you get your own entree, eat only half. Ask for a box at the beginning of your meal and put the rest of your entree in it so you are not tempted to eat it.  Look for the calories on the menu. If calories are listed, choose the lower calorie options.  Choose dishes that include vegetables, fruits, whole grains, low-fat dairy products, and lean protein. Focusing on smart food choices from each of the 5 food groups can help you stay on track at restaurants.  Choose items that are boiled, broiled, grilled, or steamed.  Choose water, milk, unsweetened iced tea, or other drinks without added sugars. If you want an alcoholic beverage, choose a lower calorie option. For example, a regular margarita can have up to 700 calories and a glass of wine has around 150.  Stay away from items that are buttered, battered, fried, or served with cream sauce. Items labeled "crispy" are usually fried, unless stated otherwise.  Ask for dressings, sauces, and syrups on the side. These are usually very high in calories, so do not eat much of them.  Watch out for salads. Many people think salads are a healthy option, but this  is often not the case. Many salads come with bacon, fried chicken, lots of cheese, fried chips, and dressing. All of these items have a lot of calories. If you want a salad, choose a garden salad and ask for grilled meats or steak. Ask for the dressing on the side, or ask for olive oil and vinegar or lemon to use as dressing.  Estimate how many servings of a food you are given. For example, a serving of cooked rice is  cup or about the size of half a tennis ball or one cupcake wrapper. Knowing serving sizes will help you be aware of how much food you are eating at restaurants. The list below tells you how big or small some common portion sizes are based on everyday objects.  1 oz--4 stacked dice.  3 oz--1 deck of cards.  1 tsp--1 dice.  1 Tbsp-- a Ping-Pong ball.  2 Tbsp--1 Ping-Pong ball.   cup--1 tennis ball or 1 cupcake wrapper.  1 cup--1 baseball.   This information is not intended to replace advice given to you by your health care provider. Make sure you discuss any questions you have with your health care provider.   Document Released: 08/08/2005 Document Revised: 08/29/2014 Document Reviewed: 06/13/2013 Elsevier Interactive Patient Education [employer].

## 2016-05-05 NOTE — Assessment & Plan Note (Signed)
>>  ASSESSMENT AND PLAN FOR OBESITY WRITTEN ON 05/05/2016  4:12 PM BY ARNETT, MARGARET G, FNP  Worsening. Information given regarding weight loss clinics. Encouraged patient to see a nutritionist and a referral was placed.

## 2016-07-06 ENCOUNTER — Other Ambulatory Visit: Payer: Self-pay

## 2016-07-06 ENCOUNTER — Other Ambulatory Visit: Payer: Self-pay | Admitting: Family Medicine

## 2016-07-06 MED ORDER — OMEPRAZOLE 20 MG PO CPDR: 20.0000 mg | DELAYED_RELEASE_CAPSULE | Freq: Every day | ORAL | 3 refills | Status: DC

## 2016-07-06 NOTE — Telephone Encounter (Signed)
Refilled 05/05/16. Pt last seen 05/05/16. Please advise?

## 2016-07-06 NOTE — Telephone Encounter (Signed)
Medication has been refilled.

## 2016-07-07 ENCOUNTER — Encounter: Payer: Managed Care, Other (non HMO) | Admitting: Family

## 2016-09-14 DIAGNOSIS — M25579 Pain in unspecified ankle and joints of unspecified foot: Secondary | ICD-10-CM | POA: Diagnosis not present

## 2016-09-14 DIAGNOSIS — M25572 Pain in left ankle and joints of left foot: Secondary | ICD-10-CM | POA: Diagnosis not present

## 2016-09-22 DIAGNOSIS — M25572 Pain in left ankle and joints of left foot: Secondary | ICD-10-CM | POA: Diagnosis not present

## 2016-09-22 DIAGNOSIS — Z981 Arthrodesis status: Secondary | ICD-10-CM | POA: Diagnosis not present

## 2016-09-22 DIAGNOSIS — M12572 Traumatic arthropathy, left ankle and foot: Secondary | ICD-10-CM | POA: Diagnosis not present

## 2016-10-13 DIAGNOSIS — M19072 Primary osteoarthritis, left ankle and foot: Secondary | ICD-10-CM | POA: Diagnosis not present

## 2016-10-13 DIAGNOSIS — M24572 Contracture, left ankle: Secondary | ICD-10-CM | POA: Diagnosis not present

## 2017-01-04 ENCOUNTER — Ambulatory Visit (INDEPENDENT_AMBULATORY_CARE_PROVIDER_SITE_OTHER): Payer: 59 | Admitting: Family Medicine

## 2017-01-04 ENCOUNTER — Encounter: Payer: Self-pay | Admitting: Family Medicine

## 2017-01-04 VITALS — BP 118/74 | HR 73 | Temp 98.2°F | Wt 278.6 lb

## 2017-01-04 DIAGNOSIS — J029 Acute pharyngitis, unspecified: Secondary | ICD-10-CM

## 2017-01-04 DIAGNOSIS — J069 Acute upper respiratory infection, unspecified: Secondary | ICD-10-CM

## 2017-01-04 LAB — POCT RAPID STREP A (OFFICE): RAPID STREP A SCREEN: NEGATIVE

## 2017-01-04 NOTE — Progress Notes (Signed)
  Heidi Rumps, MD Phone: [phone]  Heidi Hicks is a 56 y.o. female who presents today for same-day visit.  Patient notes onset of symptoms about 2-3 days ago. Has had sore throat and feels like there is a line to her right ear that is bothering her as well. Some sinus congestion though she is just blowing some blood out of her nose. Not blowing any mucus out. Minimal cough. No fevers. Does note frontal headache with this. Ibuprofen does help with it. It's a pounding headache though has eased off at this time. No vision changes, numbness, or weakness. Does note a sick contact at work. They had similar symptoms. She's not been taking any medications for this.   ROS see history of present illness  Objective  Physical Exam Vitals:   01/04/17 0832  BP: 118/74  Pulse: 73  Temp: 98.2 F (36.8 C)    BP Readings from Last 3 Encounters:  01/04/17 118/74  05/05/16 118/72  12/07/15 130/74   Wt Readings from Last 3 Encounters:  01/04/17 278 lb 9.6 oz (126.4 kg)  05/05/16 276 lb (125.2 kg)  12/07/15 265 lb 6 oz (120.4 kg)    Physical Exam  Constitutional: No distress.  HENT:  Head: Normocephalic and atraumatic.  Mild posterior oropharyngeal erythema, tonsils are absent, no exudates, normal TMs bilaterally, mild irritation of the nasal mucosa with dried blood bilaterally  Eyes: Conjunctivae are normal. Pupils are equal, round, and reactive to light.  Neck: Neck supple.  Mild right-sided anterior cervical chain tenderness though no adenopathy  Cardiovascular: Normal rate, regular rhythm and normal heart sounds.   Pulmonary/Chest: Effort normal and breath sounds normal.  Lymphadenopathy:    She has no cervical adenopathy.  Neurological: She is alert. Gait normal.  Able to stand up from chair with no issues, moves all extremities equally  Skin: Skin is warm and dry. She is not diaphoretic.     Assessment/Plan: Please see individual problem list.  Viral upper respiratory  illness Symptoms most consistent with viral upper respiratory infection. Rapid strep test is negative. No focal findings to indicate bacterial illness. Discussed supportive care. Advised on saltwater gargles and lozenges. Can use nasal saline and/or Flonase and Claritin to help with symptoms. Return precautions in AVS. If not improving over the next 4 or so days she will follow-up.   Orders Placed This Encounter  Procedures  . POCT rapid strep A    Heidi Rumps, MD [place]

## 2017-01-04 NOTE — Patient Instructions (Signed)
Nice to see you. Your symptoms are likely related to a viral illness. You can use saltwater gargles and throat lozenges for your throat. You may also take Tylenol or ibuprofen for any discomfort. This may be taken over-the-counter. You may also try nasal saline rinses and/or Flonase and Claritin to see if that will be beneficial for your symptoms as well. If you develop fevers, numbness, weakness, vision changes, worsening headache, or any new or change in symptoms please seek medical attention immediately.

## 2017-01-04 NOTE — Assessment & Plan Note (Signed)
Symptoms most consistent with viral upper respiratory infection. Rapid strep test is negative. No focal findings to indicate bacterial illness. Discussed supportive care. Advised on saltwater gargles and lozenges. Can use nasal saline and/or Flonase and Claritin to help with symptoms. Return precautions in AVS. If not improving over the next 4 or so days she will follow-up.

## 2017-04-11 DIAGNOSIS — Z719 Counseling, unspecified: Secondary | ICD-10-CM | POA: Diagnosis not present

## 2017-04-30 ENCOUNTER — Other Ambulatory Visit: Payer: Self-pay | Admitting: Family

## 2017-04-30 DIAGNOSIS — I10 Essential (primary) hypertension: Secondary | ICD-10-CM

## 2017-05-01 DIAGNOSIS — Z719 Counseling, unspecified: Secondary | ICD-10-CM | POA: Diagnosis not present

## 2017-05-09 DIAGNOSIS — Z719 Counseling, unspecified: Secondary | ICD-10-CM | POA: Diagnosis not present

## 2017-05-15 DIAGNOSIS — Z719 Counseling, unspecified: Secondary | ICD-10-CM | POA: Diagnosis not present

## 2017-05-22 DIAGNOSIS — Z719 Counseling, unspecified: Secondary | ICD-10-CM | POA: Diagnosis not present

## 2017-06-05 DIAGNOSIS — Z719 Counseling, unspecified: Secondary | ICD-10-CM | POA: Diagnosis not present

## 2017-06-12 DIAGNOSIS — Z719 Counseling, unspecified: Secondary | ICD-10-CM | POA: Diagnosis not present

## 2017-06-16 ENCOUNTER — Telehealth: Payer: Self-pay | Admitting: Family Medicine

## 2017-06-16 DIAGNOSIS — Z1239 Encounter for other screening for malignant neoplasm of breast: Secondary | ICD-10-CM

## 2017-06-16 NOTE — Telephone Encounter (Signed)
Please advise 

## 2017-06-16 NOTE — Telephone Encounter (Signed)
Pt called wanting to get a mammo done. Need order please and thank you! Pt would like to go to [facility].

## 2017-06-17 NOTE — Telephone Encounter (Signed)
Order placed. Patient needs an appointment for a physical. She is overdue for a Pap smear. Thanks.

## 2017-06-19 NOTE — Telephone Encounter (Signed)
Left message to return call 

## 2017-06-19 NOTE — Telephone Encounter (Signed)
Pt rtc. She is scheduled with dr. Caryl Bis on 12/12

## 2017-06-26 ENCOUNTER — Encounter: Payer: Self-pay | Admitting: Family Medicine

## 2017-06-26 ENCOUNTER — Telehealth: Payer: Self-pay | Admitting: Family Medicine

## 2017-06-28 ENCOUNTER — Other Ambulatory Visit: Payer: Self-pay | Admitting: Family

## 2017-07-04 DIAGNOSIS — Z719 Counseling, unspecified: Secondary | ICD-10-CM | POA: Diagnosis not present

## 2017-07-10 DIAGNOSIS — Z719 Counseling, unspecified: Secondary | ICD-10-CM | POA: Diagnosis not present

## 2017-07-17 DIAGNOSIS — Z719 Counseling, unspecified: Secondary | ICD-10-CM | POA: Diagnosis not present

## 2017-07-19 ENCOUNTER — Ambulatory Visit
Admission: RE | Admit: 2017-07-19 | Discharge: 2017-07-19 | Disposition: A | Discharge status: Home / Self Care | Payer: 59 | Source: Ambulatory Visit | Attending: Family Medicine | Admitting: Family Medicine

## 2017-07-19 DIAGNOSIS — Z1231 Encounter for screening mammogram for malignant neoplasm of breast: Secondary | ICD-10-CM | POA: Insufficient documentation

## 2017-07-19 DIAGNOSIS — Z1239 Encounter for other screening for malignant neoplasm of breast: Secondary | ICD-10-CM

## 2017-07-24 DIAGNOSIS — Z719 Counseling, unspecified: Secondary | ICD-10-CM | POA: Diagnosis not present

## 2017-07-25 ENCOUNTER — Other Ambulatory Visit: Payer: Self-pay | Admitting: *Deleted

## 2017-07-25 ENCOUNTER — Inpatient Hospital Stay
Admission: RE | Admit: 2017-07-25 | Discharge: 2017-07-25 | Disposition: A | Discharge status: Home / Self Care | Payer: Self-pay | Source: Ambulatory Visit | Attending: *Deleted | Admitting: *Deleted

## 2017-07-25 DIAGNOSIS — Z9289 Personal history of other medical treatment: Secondary | ICD-10-CM

## 2017-08-02 ENCOUNTER — Encounter: Payer: 59 | Admitting: Family Medicine

## 2017-09-29 ENCOUNTER — Other Ambulatory Visit: Payer: Self-pay | Admitting: Family Medicine

## 2017-10-10 ENCOUNTER — Encounter: Payer: Self-pay | Admitting: Family Medicine

## 2017-10-10 ENCOUNTER — Other Ambulatory Visit: Payer: Self-pay

## 2017-10-10 ENCOUNTER — Ambulatory Visit (INDEPENDENT_AMBULATORY_CARE_PROVIDER_SITE_OTHER): Payer: 59 | Admitting: Family Medicine

## 2017-10-10 VITALS — BP 130/88 | HR 83 | Temp 97.8°F | Ht 68.0 in | Wt 291.4 lb

## 2017-10-10 DIAGNOSIS — R7303 Prediabetes: Secondary | ICD-10-CM | POA: Diagnosis not present

## 2017-10-10 DIAGNOSIS — Z0000 Encounter for general adult medical examination without abnormal findings: Secondary | ICD-10-CM | POA: Insufficient documentation

## 2017-10-10 DIAGNOSIS — Z1159 Encounter for screening for other viral diseases: Secondary | ICD-10-CM | POA: Diagnosis not present

## 2017-10-10 DIAGNOSIS — Z0001 Encounter for general adult medical examination with abnormal findings: Secondary | ICD-10-CM | POA: Diagnosis not present

## 2017-10-10 DIAGNOSIS — Z1211 Encounter for screening for malignant neoplasm of colon: Secondary | ICD-10-CM

## 2017-10-10 DIAGNOSIS — I10 Essential (primary) hypertension: Secondary | ICD-10-CM

## 2017-10-10 DIAGNOSIS — Z23 Encounter for immunization: Secondary | ICD-10-CM

## 2017-10-10 DIAGNOSIS — Z13220 Encounter for screening for lipoid disorders: Secondary | ICD-10-CM | POA: Diagnosis not present

## 2017-10-10 DIAGNOSIS — J069 Acute upper respiratory infection, unspecified: Secondary | ICD-10-CM

## 2017-10-10 LAB — HEMOGLOBIN A1C: Hgb A1c MFr Bld: 6.4 % (ref 4.6–6.5)

## 2017-10-10 LAB — LIPID PANEL
Cholesterol: 170 mg/dL (ref 0–200)
HDL: 35.3 mg/dL — AB (ref >39.00)
NONHDL: 134.61
TRIGLYCERIDES: 244 mg/dL — AB (ref 0.0–149.0)
Total CHOL/HDL Ratio: 5
VLDL: 48.8 mg/dL — ABNORMAL HIGH (ref 0.0–40.0)

## 2017-10-10 LAB — COMPREHENSIVE METABOLIC PANEL
ALT: 21 U/L (ref 0–35)
AST: 23 U/L (ref 0–37)
Albumin: 4.1 g/dL (ref 3.5–5.2)
Alkaline Phosphatase: 107 U/L (ref 39–117)
BILIRUBIN TOTAL: 0.3 mg/dL (ref 0.2–1.2)
BUN: 19 mg/dL (ref 6–23)
CALCIUM: 9.3 mg/dL (ref 8.4–10.5)
CO2: 29 meq/L (ref 19–32)
Chloride: 101 mEq/L (ref 96–112)
Creatinine, Ser: 0.88 mg/dL (ref 0.40–1.20)
GFR: 85.62 mL/min (ref >60.00)
Glucose, Bld: 92 mg/dL (ref 70–99)
Potassium: 3.7 mEq/L (ref 3.5–5.1)
Sodium: 138 mEq/L (ref 135–145)
Total Protein: 7.9 g/dL (ref 6.0–8.3)

## 2017-10-10 LAB — LDL CHOLESTEROL, DIRECT: Direct LDL: 108 mg/dL

## 2017-10-10 MED ORDER — LOSARTAN POTASSIUM-HCTZ 50-12.5 MG PO TABS: ORAL_TABLET | ORAL | 3 refills | Status: DC

## 2017-10-10 MED ORDER — OMEPRAZOLE 20 MG PO CPDR: DELAYED_RELEASE_CAPSULE | ORAL | 1 refills | Status: DC

## 2017-10-10 NOTE — Assessment & Plan Note (Signed)
Blood pressure is borderline.  She will monitor at home more consistently.  Continue current regimen.

## 2017-10-10 NOTE — Patient Instructions (Signed)
Nice to see you. You can try Claritin or Zyrtec to help with your respiratory symptoms.  If you do not continue to get better please contact us and we can consider antibiotic therapy. Please work on dietary changes and exercise. We will get you set up for colonoscopy later this year. We will contact you with your lab results.

## 2017-10-10 NOTE — Assessment & Plan Note (Signed)
Symptoms consistent with viral upper respiratory infection.  She has been improving over the last several days.  She will continue to monitor and if she does not continue to improve she will let us know in therapy.

## 2017-10-10 NOTE — Progress Notes (Signed)
[name], MD Phone: [phone]  Heidi Hicks is a 57 y.o. female who presents today for physical exam.  Not working on diet or exercise.  Her life has changed recently as she is caring for her grandchild.  This has taken up a lot of her time. Reports prior Pap smear about 2 years ago at [facility].  She is postmenopausal. No prior hepatitis C or HIV testing. She would like flu shot today. Colonoscopy due later this year. Mammogram and tetanus vaccination up-to-date. No tobacco use, alcohol use, or illicit drug use.  Notes over the last couple of weeks she has had some cough and chest congestion with postnasal drip and nasal congestion.  She did have sinus congestion though that has improved.  She is blowing some green mucus out of her nose.  Some postnasal drip.  Notes her granddaughter had RSV.  She has been using over-the-counter medications.  She notes over the last several days she started to feel better.  Active Ambulatory Problems    Diagnosis Date Noted  . Hypertension 12/29/2011  . Obesity (BMI 30-39.9) 01/30/2012  . Arthralgia 02/04/2013  . Obesity 10/01/2013  . Left ankle pain 05/05/2016  . Viral upper respiratory illness 01/04/2017  . Encounter for general adult medical examination with abnormal findings 10/10/2017   Resolved Ambulatory Problems    Diagnosis Date Noted  . Dermatophytosis of nail 12/29/2011  . Medication management 10/01/2013  . Acute frontal sinusitis 08/11/2015  . Acute bronchitis 12/07/2015   Past Medical History:  Diagnosis Date  . Arthritis   . Hypertension     Family History  Problem Relation Age of Onset  . Hypertension Mother   . Diabetes Mother   . Renal cancer Mother 69  . Hypertension Father   . Diabetes Father   . Diabetes Sister   . Colon polyps Neg Hx     Social History   Socioeconomic History  . Marital status: Married    Spouse name: Not on file  . Number of children: Not on file  . Years of  education: Not on file  . Highest education level: Not on file  Social Needs  . Financial resource strain: Not on file  . Food insecurity - worry: Not on file  . Food insecurity - inability: Not on file  . Transportation needs - medical: Not on file  . Transportation needs - non-medical: Not on file  Occupational History  . Not on file  Tobacco Use  . Smoking status: Never Smoker  . Smokeless tobacco: Never Used  Substance and Sexual Activity  . Alcohol use: No  . Drug use: No  . Sexual activity: Not on file  Other Topics Concern  . Not on file  Social History Narrative   Lives in [place] with husband.    3 children. Dog in house.   Work: Medical illustrator, Public relations account executive   Diet: regular   Exercise: walks 3-4 times per week    ROS  General:  Negative for nexplained weight loss, fever Skin: Negative for new or changing mole, sore that won't heal HEENT: Negative for trouble hearing, trouble seeing, ringing in ears, mouth sores, hoarseness, change in voice, dysphagia. CV:  Negative for chest pain, dyspnea, edema, palpitations Resp: Positive for cough, negative for dyspnea, hemoptysis GI: Negative for nausea, vomiting, diarrhea, constipation, abdominal pain, melena, hematochezia. GU: Negative for dysuria, incontinence, urinary hesitance, hematuria, vaginal or penile discharge, polyuria, sexual difficulty, lumps in testicle or breasts MSK: Negative for muscle cramps  or aches, joint pain or swelling Neuro: Negative for headaches, weakness, numbness, dizziness, passing out/fainting Psych: Negative for depression, anxiety, memory problems  Objective  Physical Exam Vitals:   10/10/17 1357  BP: 130/88  Pulse: 83  Temp: 97.8 F (36.6 C)  SpO2: 99%    BP Readings from Last 3 Encounters:  10/10/17 130/88  01/04/17 118/74  05/05/16 118/72   Wt Readings from Last 3 Encounters:  10/10/17 291 lb 6.4 oz (132.2 kg)  01/04/17 278 lb 9.6 oz (126.4 kg)  05/05/16 276 lb (125.2 kg)      Physical Exam  Constitutional: No distress.  HENT:  Head: Normocephalic and atraumatic.  Mouth/Throat: Oropharynx is clear and moist. No oropharyngeal exudate.  Normal TMs  Eyes: Conjunctivae are normal. Pupils are equal, round, and reactive to light.  Neck: Neck supple.  Cardiovascular: Normal rate, regular rhythm and normal heart sounds.  Pulmonary/Chest: Effort normal and breath sounds normal.  Abdominal: Soft. Bowel sounds are normal. She exhibits no distension. There is no tenderness. There is no rebound and no guarding.  Musculoskeletal: She exhibits no edema.  Lymphadenopathy:    She has no cervical adenopathy.  Neurological: She is alert. Gait normal.  Skin: Skin is warm and dry. She is not diaphoretic.  Psychiatric: Mood and affect normal.     Assessment/Plan:   Encounter for general adult medical examination with abnormal findings Physical exam completed.  Encourage diet and exercise.  We will request Pap smear results.  Based on history of Pap smear 2 years ago it does not appear she is due for this though week we will recall her if we receive the results and they indicate sooner follow-up.  She declined breast exam.  Colonoscopy due later this year and we will place a referral to GI.  Hepatitis C testing ordered.  She opted out of HIV testing.  Flu shot given.  Did discuss that she might feel little worse with her viral illness though it was acceptable to get her flu shot today.  Lab work as outlined below.  Viral upper respiratory illness Symptoms consistent with viral upper respiratory infection.  She has been improving over the last several days.  She will continue to monitor and if she does not continue to improve she will let us know in therapy.  Hypertension Blood pressure is borderline.  She will monitor at home more consistently.  Continue current regimen.   Orders Placed This Encounter  Procedures  . Flu Vaccine QUAD 36+ mos IM  . Comp Met (CMET)  .  Lipid panel  . HgB A1c  . Hepatitis C Antibody  . LDL cholesterol, direct  . Ambulatory referral to Gastroenterology    Referral Priority:   Routine    Referral Type:   Consultation    Referral Reason:   Specialty Services Required    Number of Visits Requested:   1    No orders of the defined types were placed in this encounter.    [name], MD [place]

## 2017-10-10 NOTE — Assessment & Plan Note (Signed)
Physical exam completed.  Encourage diet and exercise.  We will request Pap smear results.  Based on history of Pap smear 2 years ago it does not appear she is due for this though week we will recall her if we receive the results and they indicate sooner follow-up.  She declined breast exam.  Colonoscopy due later this year and we will place a referral to GI.  Hepatitis C testing ordered.  She opted out of HIV testing.  Flu shot given.  Did discuss that she might feel little worse with her viral illness though it was acceptable to get her flu shot today.  Lab work as outlined below.

## 2017-10-11 LAB — HEPATITIS C ANTIBODY
Hepatitis C Ab: NONREACTIVE
SIGNAL TO CUT-OFF: 0.02 (ref <1.00)

## 2017-11-01 ENCOUNTER — Encounter: Payer: Self-pay | Admitting: Family Medicine

## 2018-02-19 ENCOUNTER — Other Ambulatory Visit: Payer: Self-pay | Admitting: Family Medicine

## 2018-02-19 DIAGNOSIS — I10 Essential (primary) hypertension: Secondary | ICD-10-CM

## 2018-02-19 NOTE — Telephone Encounter (Signed)
Last OV 10/10/17 last filled 05/05/16 30 g 3rf

## 2018-02-20 NOTE — Telephone Encounter (Signed)
Please find out what she takes this for.  Thanks.

## 2018-02-21 NOTE — Telephone Encounter (Signed)
Patient states she uses this for psoriasis

## 2018-02-27 ENCOUNTER — Encounter: Payer: Self-pay | Admitting: Internal Medicine

## 2018-04-10 ENCOUNTER — Other Ambulatory Visit: Payer: 59

## 2018-04-12 ENCOUNTER — Other Ambulatory Visit: Payer: 59

## 2018-04-17 ENCOUNTER — Telehealth: Payer: Self-pay | Admitting: Radiology

## 2018-04-17 DIAGNOSIS — E7800 Pure hypercholesterolemia, unspecified: Secondary | ICD-10-CM

## 2018-04-17 DIAGNOSIS — R7303 Prediabetes: Secondary | ICD-10-CM

## 2018-04-17 NOTE — Telephone Encounter (Signed)
Pt coming in tomorrow for labs, please place future orders. Thank you.  

## 2018-04-17 NOTE — Addendum Note (Signed)
Addended by: Leone Haven on: 04/17/2018 07:20 PM   Modules accepted: Orders

## 2018-04-17 NOTE — Telephone Encounter (Signed)
Orders placed. Patient needs to follow-up in the office after her labs return. She should have been set up with a 6 month office visit and not a lab visit.

## 2018-04-18 ENCOUNTER — Other Ambulatory Visit (INDEPENDENT_AMBULATORY_CARE_PROVIDER_SITE_OTHER): Payer: 59

## 2018-04-18 DIAGNOSIS — R7303 Prediabetes: Secondary | ICD-10-CM

## 2018-04-18 DIAGNOSIS — E7800 Pure hypercholesterolemia, unspecified: Secondary | ICD-10-CM

## 2018-04-18 LAB — COMPREHENSIVE METABOLIC PANEL
ALBUMIN: 3.9 g/dL (ref 3.5–5.2)
ALT: 13 U/L (ref 0–35)
AST: 14 U/L (ref 0–37)
Alkaline Phosphatase: 104 U/L (ref 39–117)
BILIRUBIN TOTAL: 0.5 mg/dL (ref 0.2–1.2)
BUN: 15 mg/dL (ref 6–23)
CALCIUM: 9.1 mg/dL (ref 8.4–10.5)
CO2: 29 meq/L (ref 19–32)
CREATININE: 1.09 mg/dL (ref 0.40–1.20)
Chloride: 102 mEq/L (ref 96–112)
GFR: 66.76 mL/min (ref >60.00)
Glucose, Bld: 95 mg/dL (ref 70–99)
Potassium: 3.8 mEq/L (ref 3.5–5.1)
SODIUM: 140 meq/L (ref 135–145)
Total Protein: 7.4 g/dL (ref 6.0–8.3)

## 2018-04-18 LAB — LIPID PANEL
CHOLESTEROL: 171 mg/dL (ref 0–200)
HDL: 48.1 mg/dL (ref >39.00)
LDL Cholesterol: 98 mg/dL (ref 0–99)
NonHDL: 122.81
TRIGLYCERIDES: 123 mg/dL (ref 0.0–149.0)
Total CHOL/HDL Ratio: 4
VLDL: 24.6 mg/dL (ref 0.0–40.0)

## 2018-04-18 LAB — HEMOGLOBIN A1C: Hgb A1c MFr Bld: 6.3 % (ref 4.6–6.5)

## 2018-04-24 ENCOUNTER — Other Ambulatory Visit: Payer: Self-pay

## 2018-04-24 ENCOUNTER — Encounter: Payer: Self-pay | Admitting: Internal Medicine

## 2018-04-24 ENCOUNTER — Ambulatory Visit (AMBULATORY_SURGERY_CENTER): Payer: Self-pay

## 2018-04-24 VITALS — Ht 69.0 in | Wt 283.6 lb

## 2018-04-24 DIAGNOSIS — Z86010 Personal history of colonic polyps: Secondary | ICD-10-CM

## 2018-04-24 MED ORDER — NA SULFATE-K SULFATE-MG SULF 17.5-3.13-1.6 GM/177ML PO SOLN: 1.0000 | Freq: Once | ORAL | 0 refills | Status: AC

## 2018-04-24 NOTE — Progress Notes (Signed)
No egg or soy allergy known to patient  No issues with past sedation with any surgeries  or procedures, no intubation problems  Pt is taking Phentermine advised to stop taking 10 days prior to procedure No home 02 use per patient  No blood thinners per patient  Pt denies issues with constipation  No A fib or A flutter  EMMI video sent to pt's e mail , pt declined

## 2018-05-07 ENCOUNTER — Ambulatory Visit (AMBULATORY_SURGERY_CENTER): Payer: 59 | Admitting: Internal Medicine

## 2018-05-07 ENCOUNTER — Encounter: Payer: Self-pay | Admitting: Internal Medicine

## 2018-05-07 VITALS — BP 140/69 | HR 57 | Temp 97.8°F | Resp 18 | Ht 69.0 in | Wt 283.0 lb

## 2018-05-07 DIAGNOSIS — Z1211 Encounter for screening for malignant neoplasm of colon: Secondary | ICD-10-CM | POA: Diagnosis not present

## 2018-05-07 DIAGNOSIS — Z860100 Personal history of colon polyps, unspecified: Secondary | ICD-10-CM

## 2018-05-07 MED ORDER — SODIUM CHLORIDE 0.9 % IV SOLN: 500.0000 mL | Freq: Once | INTRAVENOUS | Status: DC

## 2018-05-07 NOTE — Progress Notes (Signed)
Pt's states no medical or surgical changes since previsit or office visit. 

## 2018-05-07 NOTE — Patient Instructions (Signed)
*  Handouts given on diverticulosis and hemorrhoids.  YOU HAD AN ENDOSCOPIC PROCEDURE TODAY AT THE [facility]:   Refer to the procedure report that was given to you for any specific questions about what was found during the examination.  If the procedure report does not answer your questions, please call your gastroenterologist to clarify.  If you requested that your care partner not be given the details of your procedure findings, then the procedure report has been included in a sealed envelope for you to review at your convenience later.  YOU SHOULD EXPECT: Some feelings of bloating in the abdomen. Passage of more gas than usual.  Walking can help get rid of the air that was put into your GI tract during the procedure and reduce the bloating. If you had a lower endoscopy (such as a colonoscopy or flexible sigmoidoscopy) you may notice spotting of blood in your stool or on the toilet paper. If you underwent a bowel prep for your procedure, you may not have a normal bowel movement for a few days.  Please Note:  You might notice some irritation and congestion in your nose or some drainage.  This is from the oxygen used during your procedure.  There is no need for concern and it should clear up in a day or so.  SYMPTOMS TO REPORT IMMEDIATELY:   Following lower endoscopy (colonoscopy or flexible sigmoidoscopy):  Excessive amounts of blood in the stool  Significant tenderness or worsening of abdominal pains  Swelling of the abdomen that is new, acute  Fever of 100F or higher   For urgent or emergent issues, a gastroenterologist can be reached at any hour by calling [phone].   DIET:  We do recommend a small meal at first, but then you may proceed to your regular diet.  Drink plenty of fluids but you should avoid alcoholic beverages for 24 hours.  ACTIVITY:  You should plan to take it easy for the rest of today and you should NOT DRIVE or use heavy machinery until tomorrow  (because of the sedation medicines used during the test).    FOLLOW UP: Our staff will call the number listed on your records the next business day following your procedure to check on you and address any questions or concerns that you may have regarding the information given to you following your procedure. If we do not reach you, we will leave a message.  However, if you are feeling well and you are not experiencing any problems, there is no need to return our call.  We will assume that you have returned to your regular daily activities without incident.  If any biopsies were taken you will be contacted by phone or by letter within the next 1-3 weeks.  Please call us at [phone] if you have not heard about the biopsies in 3 weeks.    SIGNATURES/CONFIDENTIALITY: You and/or your care partner have signed paperwork which will be entered into your electronic medical record.  These signatures attest to the fact that that the information above on your After Visit Summary has been reviewed and is understood.  Full responsibility of the confidentiality of this discharge information lies with you and/or your care-partner. 

## 2018-05-07 NOTE — Progress Notes (Signed)
Report given to PACU, vssReport given to PACU, vss 

## 2018-05-07 NOTE — Op Note (Signed)
[place] Patient Name: Heidi Hicks Procedure Date: 05/07/2018 12:20 PM MRN: [id number] Endoscopist: Jerene Bears , MD Age: 57 Referring MD:  Date of Birth: 20-Jul-1961 Gender: Female Account #: [id number] Procedure:                Colonoscopy Indications:              High risk colon cancer surveillance: Personal                            history of sessile serrated colon polyp (less than                            10 mm in size) with no dysplasia, Last colonoscopy                            5 years ago Medicines:                Monitored Anesthesia Care Procedure:                Pre-Anesthesia Assessment:                           - Prior to the procedure, a History and Physical                            was performed, and patient medications and                            allergies were reviewed. The patient's tolerance of                            previous anesthesia was also reviewed. The risks                            and benefits of the procedure and the sedation                            options and risks were discussed with the patient.                            All questions were answered, and informed consent                            was obtained. Prior Anticoagulants: The patient has                            taken no previous anticoagulant or antiplatelet                            agents. ASA Grade Assessment: II - A patient with                            mild systemic disease. After reviewing the risks  and benefits, the patient was deemed in                            satisfactory condition to undergo the procedure.                           After obtaining informed consent, the colonoscope                            was passed under direct vision. Throughout the                            procedure, the patient's blood pressure, pulse, and                            oxygen saturations were monitored continuously. The                             Colonoscope was introduced through the anus and                            advanced to the cecum, identified by appendiceal                            orifice and ileocecal valve. The colonoscopy was                            performed without difficulty. The patient tolerated                            the procedure well. The quality of the bowel                            preparation was good. The ileocecal valve,                            appendiceal orifice, and rectum were photographed. Scope In: 12:24:22 PM Scope Out: 12:40:29 PM Scope Withdrawal Time: 0 hours 11 minutes 28 seconds  Total Procedure Duration: 0 hours 16 minutes 7 seconds  Findings:                 The digital rectal exam was normal.                           A few small-mouthed diverticula were found in the                            sigmoid colon and proximal ascending colon.                           Internal hemorrhoids were found during                            retroflexion. The hemorrhoids were small.  The exam was otherwise without abnormality. Complications:            No immediate complications. Estimated Blood Loss:     Estimated blood loss: none. Impression:               - Diverticulosis in the sigmoid colon and in the                            proximal ascending colon.                           - Internal hemorrhoids.                           - The examination was otherwise normal.                           - No specimens collected. Recommendation:           - Patient has a contact number available for                            emergencies. The signs and symptoms of potential                            delayed complications were discussed with the                            patient. Return to normal activities tomorrow.                            Written discharge instructions were provided to the                            patient.                            - Resume previous diet.                           - Continue present medications.                           - Repeat colonoscopy in 10 years for surveillance. Jerene Bears, MD 05/07/2018 12:49:11 PM This report has been signed electronically.

## 2018-05-08 ENCOUNTER — Telehealth: Payer: Self-pay | Admitting: Internal Medicine

## 2018-05-08 ENCOUNTER — Telehealth: Payer: Self-pay

## 2018-05-08 NOTE — Telephone Encounter (Signed)
  Follow up Call-  Call back number 05/07/2018  Post procedure Call Back phone  # [phone]  Permission to leave phone message Yes  Some recent data might be hidden     Patient questions:  Do you have a fever, pain , or abdominal swelling? No. Pain Score  0 *  Have you tolerated food without any problems? Yes.    Have you been able to return to your normal activities? Yes.    Do you have any questions about your discharge instructions: Diet   No. Medications  No. Follow up visit  No.  Do you have questions or concerns about your Care? No.  Actions: * If pain score is 4 or above: No action needed, pain <4.

## 2018-05-08 NOTE — Telephone Encounter (Signed)
Spoke with husband and he wanted [place] to send a work note to his job via e-mail,explained to him that we would be glad to give him a work note but we would go onto the computer print it out and he would have to come by the office and pick it up,he stated "then don't worry about it,I thought maybe you could have just shot the note to my job".

## 2018-06-14 ENCOUNTER — Telehealth: Payer: Self-pay | Admitting: Internal Medicine

## 2018-06-14 NOTE — Telephone Encounter (Signed)
Patient wants to speak to nurse regarding the way colon was coded

## 2018-06-14 NOTE — Telephone Encounter (Signed)
Discussed with pt that her colon was diagnostic and not screening. She states she was told her insurance would cover the procedure. She is upset because she received a bill for over 200.00. Discussed with her that it appears her insurance has paid some as the procedure is more that that amount. Pt states she will call her insurance company and discuss this with them further.

## 2018-06-29 ENCOUNTER — Other Ambulatory Visit: Payer: Self-pay | Admitting: Family Medicine

## 2018-07-17 IMAGING — MG MM DIGITAL SCREENING BILAT W/ TOMO W/ CAD
8 of 12 series · 8 of 28 positions shown · non-contrast
Comparison: Previous exam(s).

CLINICAL DATA: Screening.

EXAM:
2D DIGITAL SCREENING BILATERAL MAMMOGRAM WITH CAD AND ADJUNCT TOMO

[R MLO]
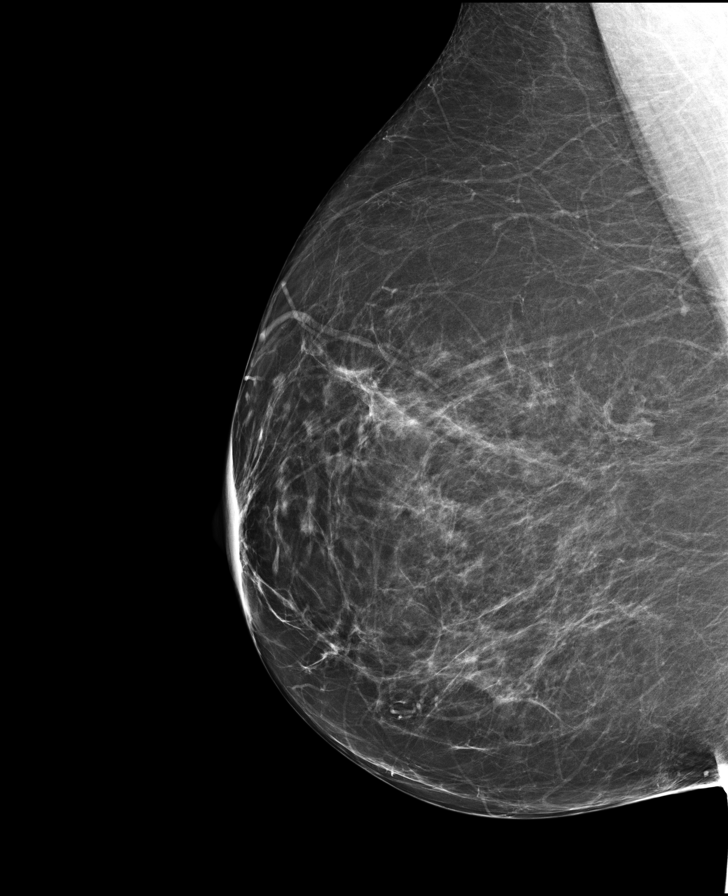

[L CC synth-2D]
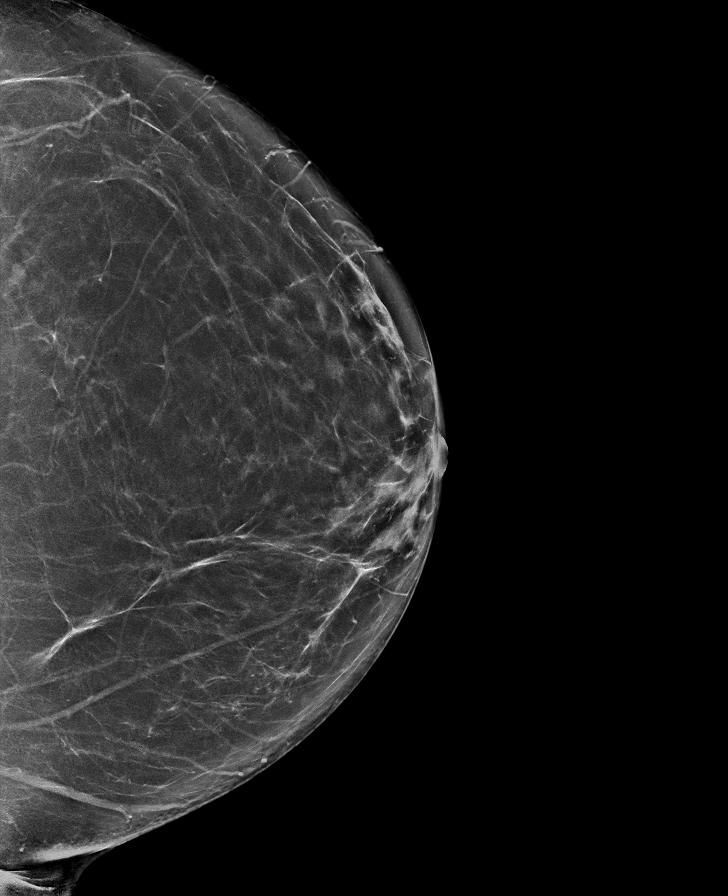

[L CC]
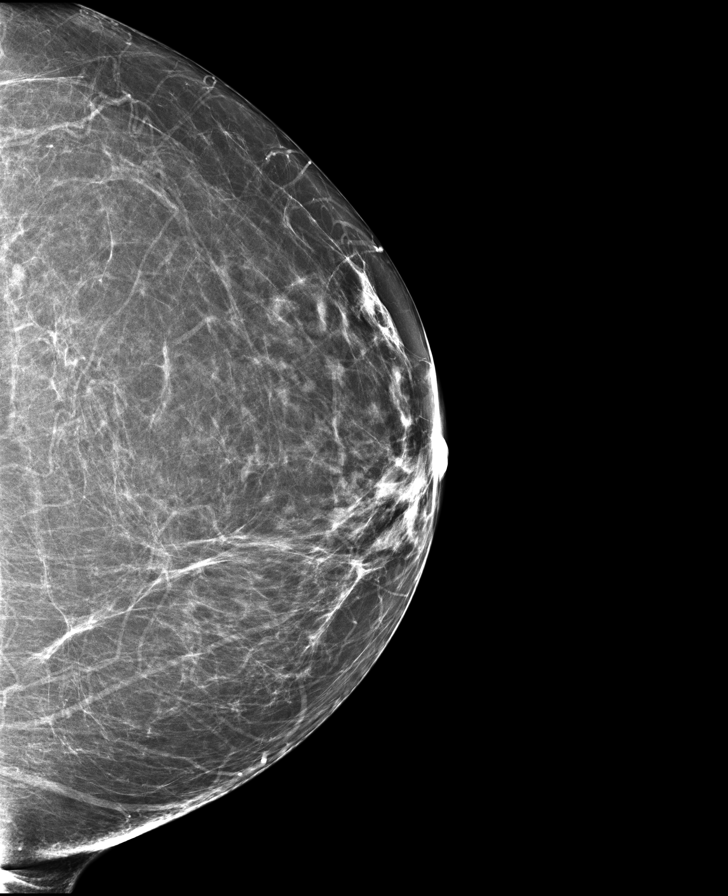

[R CC]
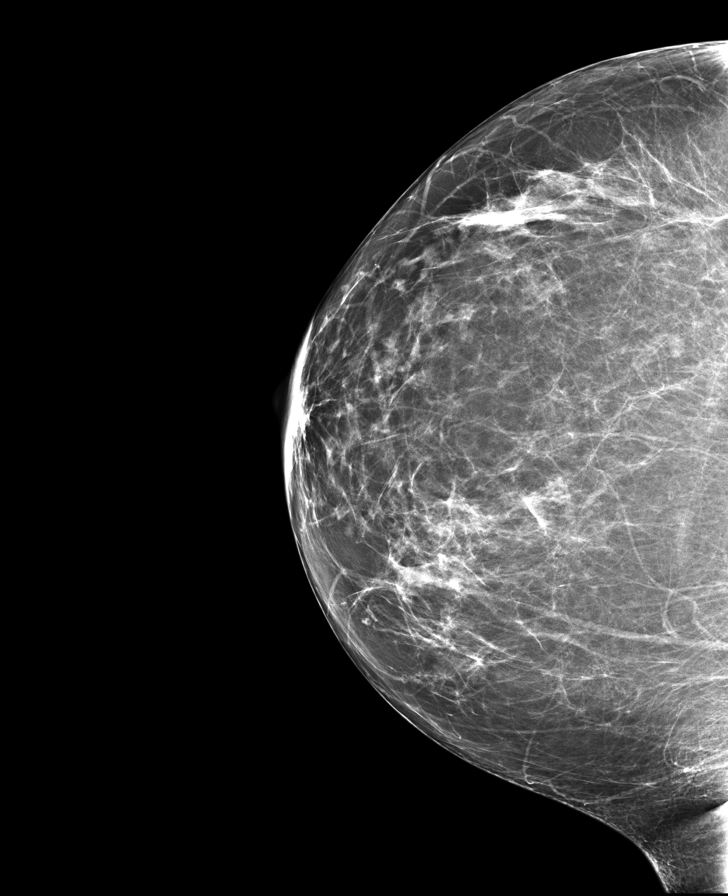

[R MLO synth-2D]
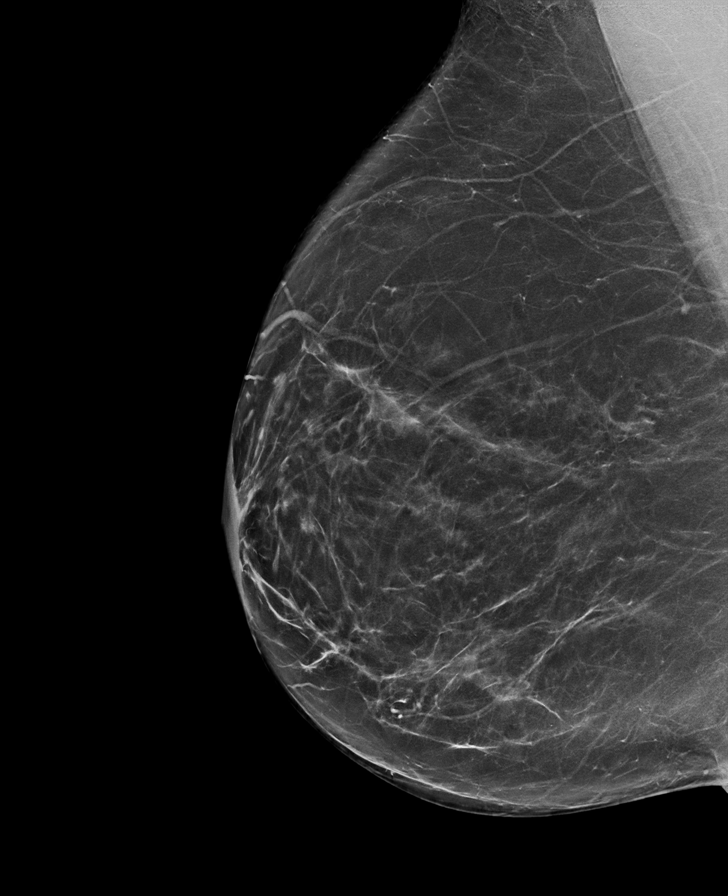

[L MLO]
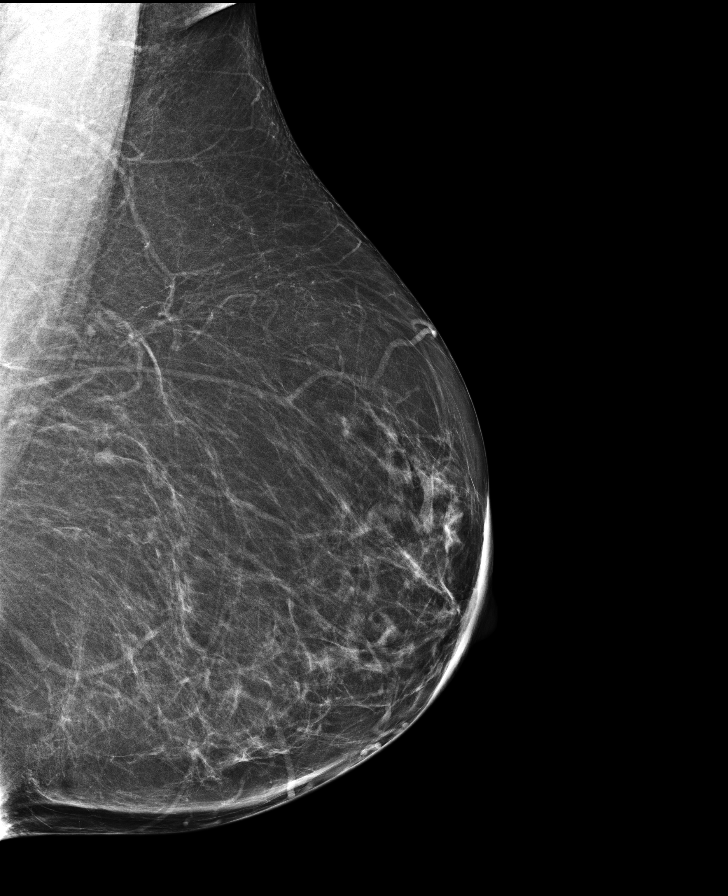

[R CC synth-2D]
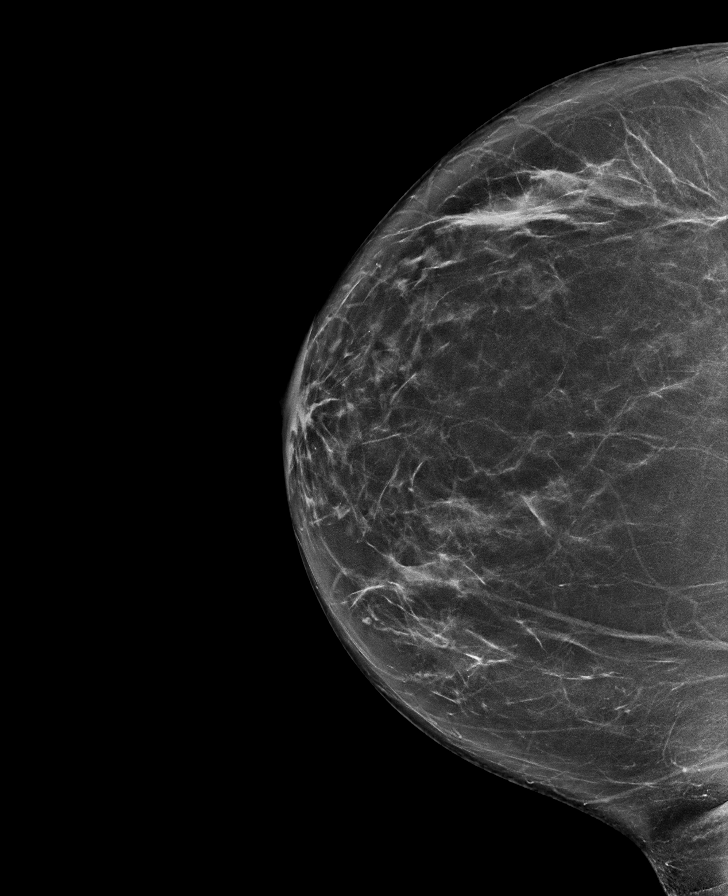

[L MLO synth-2D]
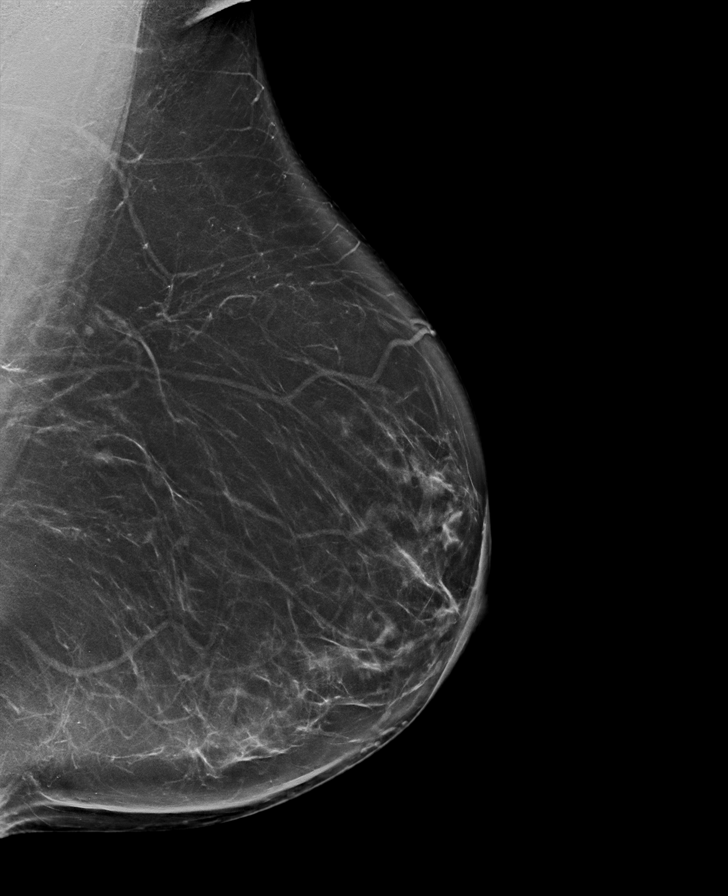

[8 of 28 positions shown; findings below may reference images not displayed]

ACR Breast Density Category b: There are scattered areas of
fibroglandular density.
FINDINGS: There are no findings suspicious for malignancy. Stable areas of
post breast reduction surgical scarring bilaterally. Images were
processed with CAD.
IMPRESSION: No mammographic evidence of malignancy. A result letter of this
screening mammogram will be mailed directly to the patient.

RECOMMENDATION:
Screening mammogram in one year. (Code:[id number])

BI-RADS CATEGORY  2: Benign.

## 2018-09-19 ENCOUNTER — Other Ambulatory Visit: Payer: Self-pay | Admitting: Family Medicine

## 2018-09-19 DIAGNOSIS — Z1231 Encounter for screening mammogram for malignant neoplasm of breast: Secondary | ICD-10-CM

## 2018-10-04 ENCOUNTER — Ambulatory Visit
Admission: RE | Admit: 2018-10-04 | Discharge: 2018-10-04 | Disposition: A | Discharge status: Home / Self Care | Payer: 59 | Source: Ambulatory Visit | Attending: Family Medicine | Admitting: Family Medicine

## 2018-10-04 DIAGNOSIS — Z1231 Encounter for screening mammogram for malignant neoplasm of breast: Secondary | ICD-10-CM | POA: Diagnosis not present

## 2018-10-29 ENCOUNTER — Other Ambulatory Visit: Payer: Self-pay | Admitting: Family Medicine

## 2018-10-29 DIAGNOSIS — I10 Essential (primary) hypertension: Secondary | ICD-10-CM

## 2018-11-23 ENCOUNTER — Other Ambulatory Visit: Payer: Self-pay | Admitting: Family Medicine

## 2018-11-23 DIAGNOSIS — I10 Essential (primary) hypertension: Secondary | ICD-10-CM

## 2018-12-04 ENCOUNTER — Other Ambulatory Visit: Payer: Self-pay | Admitting: Family Medicine

## 2018-12-04 DIAGNOSIS — I10 Essential (primary) hypertension: Secondary | ICD-10-CM

## 2018-12-29 ENCOUNTER — Other Ambulatory Visit: Payer: Self-pay | Admitting: Family Medicine

## 2018-12-29 DIAGNOSIS — I10 Essential (primary) hypertension: Secondary | ICD-10-CM

## 2018-12-31 ENCOUNTER — Other Ambulatory Visit: Payer: Self-pay | Admitting: Family Medicine

## 2018-12-31 ENCOUNTER — Encounter: Payer: Self-pay | Admitting: Family Medicine

## 2018-12-31 ENCOUNTER — Other Ambulatory Visit: Payer: Self-pay

## 2018-12-31 DIAGNOSIS — I10 Essential (primary) hypertension: Secondary | ICD-10-CM

## 2018-12-31 MED ORDER — OMEPRAZOLE 20 MG PO CPDR: 20.0000 mg | DELAYED_RELEASE_CAPSULE | Freq: Every day | ORAL | 2 refills | Status: DC

## 2019-01-01 MED ORDER — LOSARTAN POTASSIUM-HCTZ 50-12.5 MG PO TABS: ORAL_TABLET | ORAL | 0 refills | Status: DC

## 2019-01-24 ENCOUNTER — Other Ambulatory Visit: Payer: Self-pay | Admitting: Family Medicine

## 2019-01-24 DIAGNOSIS — I10 Essential (primary) hypertension: Secondary | ICD-10-CM

## 2019-01-28 ENCOUNTER — Ambulatory Visit (INDEPENDENT_AMBULATORY_CARE_PROVIDER_SITE_OTHER): Payer: 59 | Admitting: Family Medicine

## 2019-01-28 ENCOUNTER — Encounter: Payer: Self-pay | Admitting: Family Medicine

## 2019-01-28 ENCOUNTER — Other Ambulatory Visit: Payer: Self-pay

## 2019-01-28 DIAGNOSIS — G8929 Other chronic pain: Secondary | ICD-10-CM

## 2019-01-28 DIAGNOSIS — M25572 Pain in left ankle and joints of left foot: Secondary | ICD-10-CM | POA: Diagnosis not present

## 2019-01-28 DIAGNOSIS — Z6841 Body Mass Index (BMI) 40.0 and over, adult: Secondary | ICD-10-CM

## 2019-01-28 DIAGNOSIS — I10 Essential (primary) hypertension: Secondary | ICD-10-CM | POA: Diagnosis not present

## 2019-01-28 MED ORDER — LOSARTAN POTASSIUM-HCTZ 50-12.5 MG PO TABS: ORAL_TABLET | ORAL | 0 refills | Status: DC

## 2019-01-28 MED ORDER — OMEPRAZOLE 20 MG PO CPDR: 20.0000 mg | DELAYED_RELEASE_CAPSULE | Freq: Every day | ORAL | 2 refills | Status: DC

## 2019-01-28 NOTE — Progress Notes (Signed)
Virtual Visit via video Note  This visit type was conducted due to national recommendations for restrictions regarding the COVID-19 pandemic (e.g. social distancing).  This format is felt to be most appropriate for this patient at this time.  All issues noted in this document were discussed and addressed.  No physical exam was performed (except for noted visual exam findings with Video Visits).   I connected with Doree Albee today at  2:45 PM EDT by a video enabled telemedicine application and verified that I am speaking with the correct person using two identifiers. Location patient: home Location provider: work Persons participating in the virtual visit: patient, provider  I discussed the limitations, risks, security and privacy concerns of performing an evaluation and management service by telephone and the availability of in person appointments. I also discussed with the patient that there may be a patient responsible charge related to this service. The patient expressed understanding and agreed to proceed.   Reason for visit: Follow-up.  HPI: Hypertension: Not checking blood pressures.  She is taking losartan and HCTZ.  No chest pain, shortness of breath, or edema.  Left ankle pain: Patient is chronically on ibuprofen for this.  Does help with her discomfort.  She broke her ankle in the past and had a plate and then had it fused.  She is on prophylactic omeprazole.  She notes no stomach irritation or reflux with the ibuprofen.  Obesity: Patient notes she walks 3-4 times a week for an hour at a time.  Her diet was not as good while they were in quarantine though she is gotten back to a healthier diet and has cut down on snacking.   ROS: See pertinent positives and negatives per HPI.  Past Medical History:  Diagnosis Date  . Arthritis   . Hypertension     Past Surgical History:  Procedure Laterality Date  . abkle fusion  1990  . BREAST SURGERY  2002   reduction  . broken  ankle  1988  . [place]  . COLONOSCOPY    . GALLBLADDER SURGERY  2009  . POLYPECTOMY    . REDUCTION MAMMAPLASTY Bilateral 2002   anchor reduction scars  . TONSILLECTOMY AND ADENOIDECTOMY  1971    Family History  Problem Relation Age of Onset  . Hypertension Mother   . Diabetes Mother   . Renal cancer Mother 47  . Hypertension Father   . Diabetes Father   . Diabetes Sister   . Colon polyps Neg Hx   . Esophageal cancer Neg Hx   . Rectal cancer Neg Hx   . Stomach cancer Neg Hx     SOCIAL HX: Non-smoker.   Current Outpatient Medications:  .  clobetasol cream (TEMOVATE) 8.93 %, APPLY 1 APPLICATION TOPICALLY 2 (TWO) TIMES DAILY., Disp: 30 g, Rfl: 3 .  ibuprofen (ADVIL,MOTRIN) 800 MG tablet, Take 800 mg by mouth 3 (three) times daily., Disp: , Rfl: 2 .  losartan-hydrochlorothiazide (HYZAAR) 50-12.5 MG tablet, TAKE 1 TABLET BY MOUTH DAILY. *NEEDS APPT TO GET MORE REFILLS*, Disp: 90 tablet, Rfl: 0 .  omeprazole (PRILOSEC) 20 MG capsule, Take 1 capsule (20 mg total) by mouth daily., Disp: 30 capsule, Rfl: 2 .  phentermine 37.5 MG capsule, Take 37.5 mg by mouth every morning., Disp: , Rfl:   EXAM:  VITALS per patient if applicable: None.  GENERAL: alert, oriented, appears well and in no acute distress  HEENT: atraumatic, conjunttiva clear, no obvious  abnormalities on inspection of external nose and ears  NECK: normal movements of the head and neck  LUNGS: on inspection no signs of respiratory distress, breathing rate appears normal, no obvious gross SOB, gasping or wheezing  CV: no obvious cyanosis  MS: moves all visible extremities without noticeable abnormality  PSYCH/NEURO: pleasant and cooperative, no obvious depression or anxiety, speech and thought processing grossly intact  ASSESSMENT AND PLAN:  Discussed the following assessment and plan:  Essential hypertension - Plan: Basic Metabolic Panel (BMET), losartan-hydrochlorothiazide  (HYZAAR) 50-12.5 MG tablet  Chronic pain of left ankle  Class 3 severe obesity due to excess calories with serious comorbidity and body mass index (BMI) of 40.0 to 44.9 in adult [facility])  Hypertension Undetermined control.  She will check with her husband's blood pressure cuff several times over the next week and send [place] a MyChart message with her readings.  Continue current medication.  She will be scheduled for lab work by the front office staff.  Left ankle pain Stable.  She will continue her current regimen.  We will check renal function.  She will continue on prophylactic PPI.  Obesity Encouraged continuing exercise and continuing to work on her diet.  [place] office staff will contact the patient to schedule follow-up for 6 months and lab work for the next 1 to 2 weeks.  Social distancing precautions and sick precautions given regarding COVID-19.   I discussed the assessment and treatment plan with the patient. The patient was provided an opportunity to ask questions and all were answered. The patient agreed with the plan and demonstrated an understanding of the instructions.   The patient was advised to call back or seek an in-person evaluation if the symptoms worsen or if the condition fails to improve as anticipated.   [name], MD

## 2019-01-28 NOTE — Assessment & Plan Note (Signed)
Undetermined control.  She will check with her husband's blood pressure cuff several times over the next week and send [place] a MyChart message with her readings.  Continue current medication.  She will be scheduled for lab work by the front office staff.

## 2019-01-28 NOTE — Assessment & Plan Note (Signed)
>>  ASSESSMENT AND PLAN FOR OBESITY WRITTEN ON 01/28/2019  3:22 PM BY SONNENBERG, ERIC G, MD  Encouraged continuing exercise and continuing to work on her diet.

## 2019-01-28 NOTE — Assessment & Plan Note (Signed)
Encouraged continuing exercise and continuing to work on her diet.

## 2019-01-28 NOTE — Assessment & Plan Note (Signed)
Stable.  She will continue her current regimen.  We will check renal function.  She will continue on prophylactic PPI.

## 2019-02-11 ENCOUNTER — Encounter: Payer: Self-pay | Admitting: Family Medicine

## 2019-03-04 ENCOUNTER — Other Ambulatory Visit (INDEPENDENT_AMBULATORY_CARE_PROVIDER_SITE_OTHER): Payer: 59

## 2019-03-04 ENCOUNTER — Other Ambulatory Visit: Payer: Self-pay

## 2019-03-04 DIAGNOSIS — I10 Essential (primary) hypertension: Secondary | ICD-10-CM | POA: Diagnosis not present

## 2019-03-04 LAB — BASIC METABOLIC PANEL
BUN: 20 mg/dL (ref 6–23)
CO2: 27 mEq/L (ref 19–32)
Calcium: 9 mg/dL (ref 8.4–10.5)
Chloride: 101 mEq/L (ref 96–112)
Creatinine, Ser: 1.07 mg/dL (ref 0.40–1.20)
GFR: 63.96 mL/min (ref >60.00)
Glucose, Bld: 94 mg/dL (ref 70–99)
Potassium: 3.7 mEq/L (ref 3.5–5.1)
Sodium: 139 mEq/L (ref 135–145)

## 2019-04-18 NOTE — Telephone Encounter (Signed)
err

## 2019-04-25 ENCOUNTER — Other Ambulatory Visit: Payer: Self-pay | Admitting: Family Medicine

## 2019-04-25 DIAGNOSIS — I10 Essential (primary) hypertension: Secondary | ICD-10-CM

## 2019-06-07 ENCOUNTER — Ambulatory Visit (INDEPENDENT_AMBULATORY_CARE_PROVIDER_SITE_OTHER): Payer: 59

## 2019-06-07 ENCOUNTER — Other Ambulatory Visit: Payer: Self-pay

## 2019-06-07 DIAGNOSIS — Z23 Encounter for immunization: Secondary | ICD-10-CM | POA: Diagnosis not present

## 2019-06-26 ENCOUNTER — Other Ambulatory Visit: Payer: Self-pay | Admitting: Family Medicine

## 2019-08-02 ENCOUNTER — Other Ambulatory Visit: Payer: Self-pay | Admitting: Family Medicine

## 2019-08-02 DIAGNOSIS — I10 Essential (primary) hypertension: Secondary | ICD-10-CM

## 2019-08-02 NOTE — Telephone Encounter (Signed)
Medication: losartan-hydrochlorothiazide (HYZAAR) 50-12.5 MG tablet [id number] -Patient states that she is out of medication  Has the patient contacted their pharmacy? Yes  (Agent: If no, request that the patient contact the pharmacy for the refill.) (Agent: If yes, when and what did the pharmacy advise?)  Preferred Pharmacy (with phone number or street name): [id number] - [place], [address] [place] [address] Phone: [phone] Fax: [phone] Not a 24 hour pharmacy; exact hours not known.    Agent: Please be advised that RX refills may take up to 3 business days. We ask that you follow-up with your pharmacy.

## 2019-08-02 NOTE — Telephone Encounter (Signed)
Requested medication (s) are due for refill today: yes  Requested medication (s) are on the active medication list: yes  Last refill:  04/25/2019  Future visit scheduled: no  Notes to clinic:  overdue for office visit    Requested Prescriptions  Pending Prescriptions Disp Refills   losartan-hydrochlorothiazide (HYZAAR) 50-12.5 MG tablet 90 tablet 0      Cardiovascular: ARB + Diuretic Combos Failed - 08/02/2019  9:07 AM      Failed - Last BP in normal range    BP Readings from Last 1 Encounters:  05/07/18 140/69          Failed - Valid encounter within last 6 months    Recent Outpatient Visits           6 months ago Essential hypertension   [place] Sonn[place], Angela Adam, MD   1 year ago Encounter for general adult medical examination with abnormal findings   [facility] Leone Haven, MD   2 years ago Sore throat   [place] Sonnenberg, Angela Adam, MD   3 years ago Essential hypertension   [place], Yvetta Coder, [place]   3 years ago Acute bronchitis, unspecified organism   [place], [place], [place]              Passed - K in normal range and within 180 days    Potassium  Date Value Ref Range Status  03/04/2019 3.7 3.5 - 5.1 mEq/L Final          Passed - Na in normal range and within 180 days    Sodium  Date Value Ref Range Status  03/04/2019 139 135 - 145 mEq/L Final          Passed - Cr in normal range and within 180 days    Creatinine, Ser  Date Value Ref Range Status  03/04/2019 1.07 0.40 - 1.20 mg/dL Final          Passed - Ca in normal range and within 180 days    Calcium  Date Value Ref Range Status  03/04/2019 9.0 8.4 - 10.5 mg/dL Final          Passed - Patient is not pregnant

## 2019-09-23 ENCOUNTER — Other Ambulatory Visit: Payer: Self-pay | Admitting: Family Medicine

## 2019-09-29 ENCOUNTER — Other Ambulatory Visit: Payer: Self-pay

## 2019-09-29 DIAGNOSIS — Z20822 Contact with and (suspected) exposure to covid-19: Secondary | ICD-10-CM

## 2019-09-30 ENCOUNTER — Other Ambulatory Visit: Payer: Self-pay | Admitting: Family Medicine

## 2019-09-30 DIAGNOSIS — I10 Essential (primary) hypertension: Secondary | ICD-10-CM

## 2019-09-30 DIAGNOSIS — L409 Psoriasis, unspecified: Secondary | ICD-10-CM

## 2019-09-30 LAB — NOVEL CORONAVIRUS, NAA: SARS-CoV-2, NAA: NOT DETECTED

## 2019-10-01 NOTE — Telephone Encounter (Signed)
What does she use this for?

## 2019-10-08 ENCOUNTER — Other Ambulatory Visit: Payer: Self-pay | Admitting: Family Medicine

## 2019-10-08 DIAGNOSIS — Z1231 Encounter for screening mammogram for malignant neoplasm of breast: Secondary | ICD-10-CM

## 2019-10-10 DIAGNOSIS — L409 Psoriasis, unspecified: Secondary | ICD-10-CM | POA: Insufficient documentation

## 2019-10-24 ENCOUNTER — Other Ambulatory Visit: Payer: Self-pay | Admitting: Family Medicine

## 2019-10-24 DIAGNOSIS — I10 Essential (primary) hypertension: Secondary | ICD-10-CM

## 2019-10-30 ENCOUNTER — Ambulatory Visit
Admission: RE | Admit: 2019-10-30 | Discharge: 2019-10-30 | Disposition: A | Discharge status: Home / Self Care | Payer: 59 | Source: Ambulatory Visit | Attending: Family Medicine | Admitting: Family Medicine

## 2019-10-30 DIAGNOSIS — Z1231 Encounter for screening mammogram for malignant neoplasm of breast: Secondary | ICD-10-CM | POA: Diagnosis present

## 2019-12-02 ENCOUNTER — Ambulatory Visit: Payer: 59

## 2019-12-22 ENCOUNTER — Other Ambulatory Visit: Payer: Self-pay | Admitting: Family Medicine

## 2020-01-18 ENCOUNTER — Other Ambulatory Visit: Payer: Self-pay | Admitting: Family Medicine

## 2020-01-18 DIAGNOSIS — I10 Essential (primary) hypertension: Secondary | ICD-10-CM

## 2020-03-22 ENCOUNTER — Other Ambulatory Visit: Payer: Self-pay | Admitting: Internal Medicine

## 2020-04-20 ENCOUNTER — Other Ambulatory Visit: Payer: Self-pay | Admitting: Family Medicine

## 2020-04-20 DIAGNOSIS — I10 Essential (primary) hypertension: Secondary | ICD-10-CM

## 2020-06-18 ENCOUNTER — Other Ambulatory Visit: Payer: Self-pay | Admitting: Internal Medicine

## 2020-06-18 ENCOUNTER — Other Ambulatory Visit: Payer: Self-pay | Admitting: Family Medicine

## 2020-06-18 DIAGNOSIS — I10 Essential (primary) hypertension: Secondary | ICD-10-CM

## 2020-07-13 ENCOUNTER — Ambulatory Visit (INDEPENDENT_AMBULATORY_CARE_PROVIDER_SITE_OTHER): Payer: 59 | Admitting: Obstetrics and Gynecology

## 2020-07-13 ENCOUNTER — Other Ambulatory Visit: Payer: Self-pay

## 2020-07-13 ENCOUNTER — Encounter: Payer: Self-pay | Admitting: Obstetrics and Gynecology

## 2020-07-13 ENCOUNTER — Other Ambulatory Visit (HOSPITAL_COMMUNITY)
Admission: RE | Admit: 2020-07-13 | Discharge: 2020-07-13 | Disposition: A | Discharge status: Home / Self Care | Payer: 59 | Source: Ambulatory Visit | Attending: Obstetrics and Gynecology | Admitting: Obstetrics and Gynecology

## 2020-07-13 VITALS — Ht 69.0 in | Wt 279.4 lb

## 2020-07-13 DIAGNOSIS — N950 Postmenopausal bleeding: Secondary | ICD-10-CM

## 2020-07-13 DIAGNOSIS — Z1211 Encounter for screening for malignant neoplasm of colon: Secondary | ICD-10-CM

## 2020-07-13 DIAGNOSIS — Z1231 Encounter for screening mammogram for malignant neoplasm of breast: Secondary | ICD-10-CM | POA: Diagnosis not present

## 2020-07-13 DIAGNOSIS — Z01419 Encounter for gynecological examination (general) (routine) without abnormal findings: Secondary | ICD-10-CM | POA: Diagnosis not present

## 2020-07-13 DIAGNOSIS — Z124 Encounter for screening for malignant neoplasm of cervix: Secondary | ICD-10-CM | POA: Diagnosis not present

## 2020-07-13 DIAGNOSIS — Z0000 Encounter for general adult medical examination without abnormal findings: Secondary | ICD-10-CM

## 2020-07-13 NOTE — Progress Notes (Signed)
Gynecology Annual Exam  PCP: Leone Haven, MD  Chief Complaint:  Chief Complaint  Patient presents with  . Gynecologic Exam    Annual Exam    History of Present Illness: Patient is a 59 y.o. G3P3003 presents for annual exam. The patient has no complaints today.   LMP: Patient's last menstrual period was 07/23/2011. Reports occasional once a year spotting. Very small seen when wiping generally.   The patient is sexually active. She denies dyspareunia.  Postcoital Bleeding: no    The patient does perform self breast exams.  There is no notable family history of breast or ovarian cancer in her family.  The patient has regular exercise: walks 2-3 times a weel   Review of Systems: Review of Systems  Constitutional: Negative for chills, fever, malaise/fatigue and weight loss.  HENT: Negative for congestion, hearing loss and sinus pain.   Eyes: Negative for blurred vision and double vision.  Respiratory: Negative for cough, sputum production, shortness of breath and wheezing.   Cardiovascular: Negative for chest pain, palpitations, orthopnea and leg swelling.  Gastrointestinal: Negative for abdominal pain, constipation, diarrhea, nausea and vomiting.  Genitourinary: Negative for dysuria, flank pain, frequency, hematuria and urgency.  Musculoskeletal: Negative for back pain, falls and joint pain.  Skin: Negative for itching and rash.  Neurological: Negative for dizziness and headaches.  Psychiatric/Behavioral: Negative for depression, substance abuse and suicidal ideas. The patient is not nervous/anxious.     Past Medical History:  Past Medical History:  Diagnosis Date  . Arthritis   . Hypertension     Past Surgical History:  Past Surgical History:  Procedure Laterality Date  . abkle fusion  1990  . BREAST SURGERY  2002   reduction  . broken ankle  1988  . [place]  . COLONOSCOPY    . GALLBLADDER SURGERY  2009  . POLYPECTOMY    .  REDUCTION MAMMAPLASTY Bilateral 2002   anchor reduction scars  . TONSILLECTOMY AND ADENOIDECTOMY  1971    Gynecologic History:  Patient's last menstrual period was 07/23/2011. Last Pap: Results were: unknown  Last mammogram: 10/30/2019 Results were: BI-RAD I Obstetric History: [id number]  Family History:  Family History  Problem Relation Age of Onset  . Hypertension Mother   . Diabetes Mother   . Renal cancer Mother 75  . Hypertension Father   . Diabetes Father   . Diabetes Sister   . Colon polyps Neg Hx   . Esophageal cancer Neg Hx   . Rectal cancer Neg Hx   . Stomach cancer Neg Hx     Social History:  Social History   Socioeconomic History  . Marital status: Married    Spouse name: Not on file  . Number of children: Not on file  . Years of education: Not on file  . Highest education level: Not on file  Occupational History  . Not on file  Tobacco Use  . Smoking status: Never Smoker  . Smokeless tobacco: Never Used  Substance and Sexual Activity  . Alcohol use: No  . Drug use: No  . Sexual activity: Not on file  Other Topics Concern  . Not on file  Social History Narrative   Lives in [place] with husband.    3 children. Dog in house.   Work: Medical illustrator, [employer]   Diet: regular   Exercise: walks 3-4 times per week   Social Determinants of Radio broadcast assistant Strain:   .  Difficulty of Paying Living Expenses: Not on file  Food Insecurity:   . Worried About Charity fundraiser in the Last Year: Not on file  . Ran Out of Food in the Last Year: Not on file  Transportation Needs:   . Lack of Transportation (Medical): Not on file  . Lack of Transportation (Non-Medical): Not on file  Physical Activity:   . Days of Exercise per Week: Not on file  . Minutes of Exercise per Session: Not on file  Stress:   . Feeling of Stress : Not on file  Social Connections:   . Frequency of Communication with Friends and Family: Not on file  . Frequency of  Social Gatherings with Friends and Family: Not on file  . Attends Religious Services: Not on file  . Active Member of Clubs or Organizations: Not on file  . Attends Archivist Meetings: Not on file  . Marital Status: Not on file  Intimate Partner Violence:   . Fear of Current or Ex-Partner: Not on file  . Emotionally Abused: Not on file  . Physically Abused: Not on file  . Sexually Abused: Not on file    Allergies:  Allergies  Allergen Reactions  . Ace Inhibitors Cough    No cough or complaints with Losartan.     Medications: Prior to Admission medications   Medication Sig Start Date End Date Taking? Authorizing Provider  clobetasol cream (TEMOVATE) 0.05 % APPLY TO AFFECTED AREA TWICE A DAY 10/10/19  Yes Leone Haven, MD  ibuprofen (ADVIL,MOTRIN) 800 MG tablet Take 800 mg by mouth 3 (three) times daily. 04/02/18  Yes [provider]  losartan-hydrochlorothiazide (HYZAAR) 50-12.5 MG tablet TAKE 1 TABLET BY MOUTH DAILY. *NEEDS APPT TO GET MORE REFILLS* 06/18/20  Yes Leone Haven, MD  omeprazole (PRILOSEC) 20 MG capsule TAKE 1 CAPSULE BY MOUTH EVERY DAY 06/18/20  Yes Einar Pheasant, MD  phentermine 37.5 MG capsule Take 37.5 mg by mouth every morning. Patient not taking: Reported on 07/13/2020    [provider]    Physical Exam Vitals: Height 5\' 9"  (1.753 m), weight 279 lb 6.4 oz (126.7 kg), last menstrual period 07/23/2011.  Physical Exam Constitutional:      Appearance: She is well-developed.  Genitourinary:     Vagina and uterus normal.     No lesions in the vagina.     No cervical motion tenderness.     No right or left adnexal mass present.     Genitourinary Comments: External: Normal appearing vulva. No lesions noted.  Speculum examination: Normal appearing cervix. No blood in the vaginal vault. No discharge.   Bimanual examination: Uterus midline, non-tender, normal in size, shape and contour.  No CMT. No adnexal masses. No adnexal  tenderness. Pelvis not fixed.     HENT:     Head: Normocephalic and atraumatic.  Neck:     Thyroid: No thyromegaly.  Cardiovascular:     Rate and Rhythm: Normal rate and regular rhythm.     Heart sounds: Normal heart sounds.  Pulmonary:     Effort: Pulmonary effort is normal.     Breath sounds: Normal breath sounds.  Chest:     Breasts:        Right: No inverted nipple, mass, nipple discharge or skin change.        Left: No inverted nipple, mass, nipple discharge or skin change.  Abdominal:     General: Bowel sounds are normal. There is no distension.  Palpations: Abdomen is soft. There is no mass.  Musculoskeletal:     Cervical back: Neck supple.  Neurological:     Mental Status: She is alert and oriented to person, place, and time.  Skin:    General: Skin is warm and dry.  Psychiatric:        Behavior: Behavior normal.        Thought Content: Thought content normal.        Judgment: Judgment normal.  Vitals reviewed.      Female chaperone present for pelvic and breast  portions of the physical exam  Assessment: 59 y.o. G3P3003 routine annual exam  Plan: Problem List Items Addressed This Visit    None    Visit Diagnoses    Encounter for annual routine gynecological examination    -  Primary   Health maintenance examination       Breast cancer screening by mammogram       Relevant Orders   MM 3D SCREEN BREAST BILATERAL   Cervical cancer screening       Relevant Orders   Cytology - PAP   Colon cancer screening       Postmenopausal bleeding       Relevant Orders   US PELVIS TRANSVAGINAL NON-OB (TV ONLY)      1) Mammogram - recommend yearly screening mammogram.  Mammogram Was ordered today  2) STI screening was offered and declined  3) ASCCP guidelines and rational discussed.  Patient opts for every 3 years screening interval  4) Colonoscopy - 2019- needed every 5 years, history of sessile polyp  5) Postmenopausal bleeding- patient will follow up for  a pelvic US  6) Routine healthcare maintenance including cholesterol, diabetes screening discussed managed by PCP   Adrian Prows MD, [place], [facility] 07/14/2020 10:17 PM

## 2020-07-13 NOTE — Patient Instructions (Signed)
Institute of Medicine Recommended Dietary Allowances for Calcium and Vitamin D  Age (yr) Calcium Recommended Dietary Allowance (mg/day) Vitamin D Recommended Dietary Allowance (international units/day)  9-18 1,300 600  19-50 1,000 600  51-70 1,200 600  71 and older 1,200 800  Data from Institute of Medicine. Dietary reference intakes: calcium, vitamin D. [place], [place]: National Academies Press; 2011.     Exercising to Stay Healthy To become healthy and stay healthy, it is recommended that you do moderate-intensity and vigorous-intensity exercise. You can tell that you are exercising at a moderate intensity if your heart starts beating faster and you start breathing faster but can still hold a conversation. You can tell that you are exercising at a vigorous intensity if you are breathing much harder and faster and cannot hold a conversation while exercising. Exercising regularly is important. It has many health benefits, such as:  Improving overall fitness, flexibility, and endurance.  Increasing bone density.  Helping with weight control.  Decreasing body fat.  Increasing muscle strength.  Reducing stress and tension.  Improving overall health. How often should I exercise? Choose an activity that you enjoy, and set realistic goals. Your health care provider can help you make an activity plan that works for you. Exercise regularly as told by your health care provider. This may include:  Doing strength training two times a week, such as: ? Lifting weights. ? Using resistance bands. ? Push-ups. ? Sit-ups. ? Yoga.  Doing a certain intensity of exercise for a given amount of time. Choose from these options: ? A total of 150 minutes of moderate-intensity exercise every week. ? A total of 75 minutes of vigorous-intensity exercise every week. ? A mix of moderate-intensity and vigorous-intensity exercise every week. Children, pregnant women, people who have not exercised  regularly, people who are overweight, and older adults may need to talk with a health care provider about what activities are safe to do. If you have a medical condition, be sure to talk with your health care provider before you start a new exercise program. What are some exercise ideas? Moderate-intensity exercise ideas include:  Walking 1 mile (1.6 km) in about 15 minutes.  Biking.  Hiking.  Golfing.  Dancing.  Water aerobics. Vigorous-intensity exercise ideas include:  Walking 4.5 miles (7.2 km) or more in about 1 hour.  Jogging or running 5 miles (8 km) in about 1 hour.  Biking 10 miles (16.1 km) or more in about 1 hour.  Lap swimming.  Roller-skating or in-line skating.  Cross-country skiing.  Vigorous competitive sports, such as football, basketball, and soccer.  Jumping rope.  Aerobic dancing. What are some everyday activities that can help me to get exercise?  Yard work, such as: ? Pushing a lawn mower. ? Raking and bagging leaves.  Washing your car.  Pushing a stroller.  Shoveling snow.  Gardening.  Washing windows or floors. How can I be more active in my day-to-day activities?  Use stairs instead of an elevator.  Take a walk during your lunch break.  If you drive, park your car farther away from your work or school.  If you take public transportation, get off one stop early and walk the rest of the way.  Stand up or walk around during all of your indoor phone calls.  Get up, stretch, and walk around every 30 minutes throughout the day.  Enjoy exercise with a friend. Support to continue exercising will help you keep a regular routine of activity. What guidelines can   I follow while exercising?  Before you start a new exercise program, talk with your health care provider.  Do not exercise so much that you hurt yourself, feel dizzy, or get very short of breath.  Wear comfortable clothes and wear shoes with good support.  Drink plenty of  water while you exercise to prevent dehydration or heat stroke.  Work out until your breathing and your heartbeat get faster. Where to find more information  U.S. Department of Health and Human Services: www.hhs.gov  Centers for Disease Control and Prevention (CDC): www.cdc.gov Summary  Exercising regularly is important. It will improve your overall fitness, flexibility, and endurance.  Regular exercise also will improve your overall health. It can help you control your weight, reduce stress, and improve your bone density.  Do not exercise so much that you hurt yourself, feel dizzy, or get very short of breath.  Before you start a new exercise program, talk with your health care provider. This information is not intended to replace advice given to you by your health care provider. Make sure you discuss any questions you have with your health care provider. Document Revised: 07/21/2017 Document Reviewed: 06/29/2017 Elsevier Patient Education  2020 Elsevier Inc.   Budget-Friendly Healthy Eating There are many ways to save money at the grocery store and continue to eat healthy. You can be successful if you:  Plan meals according to your budget.  Make a grocery list and only purchase food according to your grocery list.  Prepare food yourself. What are tips for following this plan?  Reading food labels  Compare food labels between brand name foods and the store brand. Often the nutritional value is the same, but the store brand is lower cost.  Look for products that do not have added sugar, fat, or salt (sodium). These often cost the same but are healthier for you. Products may be labeled as: ? Sugar-free. ? Nonfat. ? Low-fat. ? Sodium-free. ? Low-sodium.  Look for lean ground beef labeled as at least 92% lean and 8% fat. Shopping  Buy only the items on your grocery list and go only to the areas of the store that have the items on your list.  Use coupons only for foods  and brands you normally buy. Avoid buying items you wouldn't normally buy simply because they are on sale.  Check online and in newspapers for weekly deals.  Buy healthy items from the bulk bins when available, such as herbs, spices, flour, pasta, nuts, and dried fruit.  Buy fruits and vegetables that are in season. Prices are usually lower on in-season produce.  Look at the unit price on the price tag. Use it to compare different brands and sizes to find out which item is the best deal.  Choose healthy items that are often low-cost, such as carrots, potatoes, apples, bananas, and oranges. Dried or canned beans are a low-cost protein source.  Buy in bulk and freeze extra food. Items you can buy in bulk include meats, fish, poultry, frozen fruits, and frozen vegetables.  Avoid buying "ready-to-eat" foods, such as pre-cut fruits and vegetables and pre-made salads.  If possible, shop around to discover where you can find the best prices. Consider other retailers such as dollar stores, larger wholesale stores, local fruit and vegetable stands, and farmers markets.  Do not shop when you are hungry. If you shop while hungry, it may be hard to stick to your list and budget.  Resist impulse buying. Use your grocery list as   your official plan for the week.  Buy a variety of vegetables and fruits by purchasing fresh, frozen, and canned items.  Look at the top and bottom shelves for deals. Foods at eye level (eye level of an adult or child) are usually more expensive.  Be efficient with your time when shopping. The more time you spend at the store, the more money you are likely to spend.  To save money when choosing more expensive foods like meats and dairy: ? Choose cheaper cuts of meat, such as bone-in chicken thighs and drumsticks instead of skinless and boneless chicken. When you are ready to prepare the chicken, you can remove the skin yourself to make it healthier. ? Choose lean meats like  chicken or turkey instead of beef. ? Choose canned seafood, such as tuna, salmon, or sardines. ? Buy eggs as a low-cost source of protein. ? Buy dried beans and peas, such as lentils, split peas, or kidney beans instead of meats. Dried beans and peas are a good alternative source of protein. ? Buy the larger tubs of yogurt instead of individual-sized containers.  Choose water instead of sodas and other sweetened beverages.  Avoid buying chips, cookies, and other "junk food." These items are usually expensive and not healthy. Cooking  Make extra food and freeze the extras in meal-sized containers or in individual portions for fast meals and snacks.  Pre-cook on days when you have extra time to prepare meals in advance. You can keep these meals in the fridge or freezer and reheat for a quick meal.  When you come home from the grocery store, wash, peel, and cut fruits and vegetables so they are ready to use and eat. This will help reduce food waste. Meal planning  Do not eat out or get fast food. Prepare food at home.  Make a grocery list and make sure to bring it with you to the store. If you have a smart phone, you could use your phone to create your shopping list.  Plan meals and snacks according to a grocery list and budget you create.  Use leftovers in your meal plan for the week.  Look for recipes where you can cook once and make enough food for two meals.  Include budget-friendly meals like stews, casseroles, and stir-fry dishes.  Try some meatless meals or try "no cook" meals like salads.  Make sure that half your plate is filled with fruits or vegetables. Choose from fresh, frozen, or canned fruits and vegetables. If eating canned, remember to rinse them before eating. This will remove any excess salt added for packaging. Summary  Eating healthy on a budget is possible if you plan your meals according to your budget, purchase according to your budget and grocery list, and  prepare food yourself.  Tips for buying more food on a limited budget include buying generic brands, using coupons only for foods you normally buy, and buying healthy items from the bulk bins when available.  Tips for buying cheaper food to replace expensive food include choosing cheaper, lean cuts of meat, and buying dried beans and peas. This information is not intended to replace advice given to you by your health care provider. Make sure you discuss any questions you have with your health care provider. Document Revised: 08/09/2017 Document Reviewed: 08/09/2017 Elsevier Patient Education  2020 Elsevier Inc.   Bone Health Bones protect organs, store calcium, anchor muscles, and support the whole body. Keeping your bones strong is important, especially as you   get older. You can take actions to help keep your bones strong and healthy. Why is keeping my bones healthy important?  Keeping your bones healthy is important because your body constantly replaces bone cells. Cells get old, and new cells take their place. As we age, we lose bone cells because the body may not be able to make enough new cells to replace the old cells. The amount of bone cells and bone tissue you have is referred to as bone mass. The higher your bone mass, the stronger your bones. The aging process leads to an overall loss of bone mass in the body, which can increase the likelihood of:  Joint pain and stiffness.  Broken bones.  A condition in which the bones become weak and brittle (osteoporosis). A large decline in bone mass occurs in older adults. In women, it occurs about the time of menopause. What actions can I take to keep my bones healthy? Good health habits are important for maintaining healthy bones. This includes eating nutritious foods and exercising regularly. To have healthy bones, you need to get enough of the right minerals and vitamins. Most nutrition experts recommend getting these nutrients from the  foods that you eat. In some cases, taking supplements may also be recommended. Doing certain types of exercise is also important for bone health. What are the nutritional recommendations for healthy bones?  Eating a well-balanced diet with plenty of calcium and vitamin D will help to protect your bones. Nutritional recommendations vary from person to person. Ask your health care provider what is healthy for you. Here are some general guidelines. Get enough calcium Calcium is the most important (essential) mineral for bone health. Most people can get enough calcium from their diet, but supplements may be recommended for people who are at risk for osteoporosis. Good sources of calcium include:  Dairy products, such as low-fat or nonfat milk, cheese, and yogurt.  Dark green leafy vegetables, such as bok choy and broccoli.  Calcium-fortified foods, such as orange juice, cereal, bread, soy beverages, and tofu products.  Nuts, such as almonds. Follow these recommended amounts for daily calcium intake:  Children, age 1-3: 700 mg.  Children, age 4-8: 1,000 mg.  Children, age 9-13: 1,300 mg.  Teens, age 14-18: 1,300 mg.  Adults, age 19-50: 1,000 mg.  Adults, age 51-70: ? Men: 1,000 mg. ? Women: 1,200 mg.  Adults, age 71 or older: 1,200 mg.  Pregnant and breastfeeding females: ? Teens: 1,300 mg. ? Adults: 1,000 mg. Get enough vitamin D Vitamin D is the most essential vitamin for bone health. It helps the body absorb calcium. Sunlight stimulates the skin to make vitamin D, so be sure to get enough sunlight. If you live in a cold climate or you do not get outside often, your health care provider may recommend that you take vitamin D supplements. Good sources of vitamin D in your diet include:  Egg yolks.  Saltwater fish.  Milk and cereal fortified with vitamin D. Follow these recommended amounts for daily vitamin D intake:  Children and teens, age 1-18: 600 international  units.  Adults, age 50 or younger: 400-800 international units.  Adults, age 51 or older: 800-1,000 international units. Get other important nutrients Other nutrients that are important for bone health include:  Phosphorus. This mineral is found in meat, poultry, dairy foods, nuts, and legumes. The recommended daily intake for adult men and adult women is 700 mg.  Magnesium. This mineral is found in seeds, nuts, dark   green vegetables, and legumes. The recommended daily intake for adult men is 400-420 mg. For adult women, it is 310-320 mg.  Vitamin K. This vitamin is found in green leafy vegetables. The recommended daily intake is 120 mg for adult men and 90 mg for adult women. What type of physical activity is best for building and maintaining healthy bones? Weight-bearing and strength-building activities are important for building and maintaining healthy bones. Weight-bearing activities cause muscles and bones to work against gravity. Strength-building activities increase the strength of the muscles that support bones. Weight-bearing and muscle-building activities include:  Walking and hiking.  Jogging and running.  Dancing.  Gym exercises.  Lifting weights.  Tennis and racquetball.  Climbing stairs.  Aerobics. Adults should get at least 30 minutes of moderate physical activity on most days. Children should get at least 60 minutes of moderate physical activity on most days. Ask your health care provider what type of exercise is best for you. How can I find out if my bone mass is low? Bone mass can be measured with an X-ray test called a bone mineral density (BMD) test. This test is recommended for all women who are age 65 or older. It may also be recommended for:  Men who are age 70 or older.  People who are at risk for osteoporosis because of: ? Having bones that break easily. ? Having a long-term disease that weakens bones, such as kidney disease or rheumatoid  arthritis. ? Having menopause earlier than normal. ? Taking medicine that weakens bones, such as steroids, thyroid hormones, or hormone treatment for breast cancer or prostate cancer. ? Smoking. ? Drinking three or more alcoholic drinks a day. If you find that you have a low bone mass, you may be able to prevent osteoporosis or further bone loss by changing your diet and lifestyle. Where can I find more information? For more information, check out the following websites:  National Osteoporosis Foundation: www.nof.org/patients  National Institutes of Health: www.bones.nih.gov  International Osteoporosis Foundation: www.iofbonehealth.org Summary  The aging process leads to an overall loss of bone mass in the body, which can increase the likelihood of broken bones and osteoporosis.  Eating a well-balanced diet with plenty of calcium and vitamin D will help to protect your bones.  Weight-bearing and strength-building activities are also important for building and maintaining strong bones.  Bone mass can be measured with an X-ray test called a bone mineral density (BMD) test. This information is not intended to replace advice given to you by your health care provider. Make sure you discuss any questions you have with your health care provider. Document Revised: 09/04/2017 Document Reviewed: 09/04/2017 Elsevier Patient Education  2020 Elsevier Inc.   

## 2020-07-13 NOTE — Progress Notes (Signed)
ANNUAL EXAM.

## 2020-07-14 ENCOUNTER — Encounter: Payer: Self-pay | Admitting: Obstetrics and Gynecology

## 2020-07-20 LAB — CYTOLOGY - PAP
Comment: NEGATIVE
Diagnosis: NEGATIVE
High risk HPV: NEGATIVE

## 2020-08-07 ENCOUNTER — Other Ambulatory Visit: Payer: Self-pay

## 2020-08-07 ENCOUNTER — Encounter: Payer: Self-pay | Admitting: Emergency Medicine

## 2020-08-07 DIAGNOSIS — K922 Gastrointestinal hemorrhage, unspecified: Principal | ICD-10-CM | POA: Insufficient documentation

## 2020-08-07 DIAGNOSIS — I10 Essential (primary) hypertension: Secondary | ICD-10-CM | POA: Insufficient documentation

## 2020-08-07 DIAGNOSIS — Z20822 Contact with and (suspected) exposure to covid-19: Secondary | ICD-10-CM | POA: Insufficient documentation

## 2020-08-07 DIAGNOSIS — Z79899 Other long term (current) drug therapy: Secondary | ICD-10-CM | POA: Insufficient documentation

## 2020-08-07 DIAGNOSIS — R197 Diarrhea, unspecified: Secondary | ICD-10-CM | POA: Diagnosis present

## 2020-08-07 LAB — CBC
HCT: 41.9 % (ref 36.0–46.0)
Hemoglobin: 13.1 g/dL (ref 12.0–15.0)
MCH: 28 pg (ref 26.0–34.0)
MCHC: 31.3 g/dL (ref 30.0–36.0)
MCV: 89.5 fL (ref 80.0–100.0)
Platelets: 347 10*3/uL (ref 150–400)
RBC: 4.68 MIL/uL (ref 3.87–5.11)
RDW: 14 % (ref 11.5–15.5)
WBC: 9 10*3/uL (ref 4.0–10.5)
nRBC: 0 % (ref 0.0–0.2)

## 2020-08-07 LAB — COMPREHENSIVE METABOLIC PANEL
ALT: 18 U/L (ref 0–44)
AST: 19 U/L (ref 15–41)
Albumin: 3.9 g/dL (ref 3.5–5.0)
Alkaline Phosphatase: 103 U/L (ref 38–126)
Anion gap: 9 (ref 5–15)
BUN: 17 mg/dL (ref 6–20)
CO2: 29 mmol/L (ref 22–32)
Calcium: 9.1 mg/dL (ref 8.9–10.3)
Chloride: 100 mmol/L (ref 98–111)
Creatinine, Ser: 0.98 mg/dL (ref 0.44–1.00)
GFR, Estimated: >60 mL/min (ref >60)
Glucose, Bld: 126 mg/dL — ABNORMAL HIGH (ref 70–99)
Potassium: 3.8 mmol/L (ref 3.5–5.1)
Sodium: 138 mmol/L (ref 135–145)
Total Bilirubin: 0.6 mg/dL (ref 0.3–1.2)
Total Protein: 7.8 g/dL (ref 6.5–8.1)

## 2020-08-07 LAB — TYPE AND SCREEN
ABO/RH(D): O POS
Antibody Screen: NEGATIVE

## 2020-08-07 LAB — LIPASE, BLOOD: Lipase: 26 U/L (ref 11–51)

## 2020-08-07 MED ORDER — ONDANSETRON HCL 4 MG/2ML IJ SOLN
4.0000 mg | Freq: Once | INTRAMUSCULAR | Status: AC
  Administered 2020-08-07: 4 mg via INTRAVENOUS
  Filled 2020-08-07: qty 2

## 2020-08-07 MED ORDER — MORPHINE SULFATE (PF) 4 MG/ML IV SOLN
4.0000 mg | Freq: Once | INTRAVENOUS | Status: AC
  Administered 2020-08-07: 4 mg via INTRAVENOUS
  Filled 2020-08-07: qty 1

## 2020-08-07 NOTE — ED Triage Notes (Signed)
Pt reports since 11am she has been having abdominal and diarrhea, reports this evening she had another episode of diarrhea and it was blood reports color of stool was dark red. Pt denies any other symptom at present. Pt talks in complete sentences

## 2020-08-07 NOTE — ED Notes (Signed)
Pt requesting pain medication. Secure chat message sent to dr. Beather Arbour.

## 2020-08-08 ENCOUNTER — Observation Stay
Admission: EM | Admit: 2020-08-08 | Discharge: 2020-08-09 | Disposition: A | Discharge status: Home / Self Care | Payer: 59 | Attending: Internal Medicine | Admitting: Internal Medicine

## 2020-08-08 ENCOUNTER — Emergency Department: Payer: 59

## 2020-08-08 ENCOUNTER — Encounter: Payer: Self-pay | Admitting: Radiology

## 2020-08-08 DIAGNOSIS — K625 Hemorrhage of anus and rectum: Secondary | ICD-10-CM | POA: Diagnosis present

## 2020-08-08 DIAGNOSIS — R197 Diarrhea, unspecified: Secondary | ICD-10-CM

## 2020-08-08 DIAGNOSIS — I10 Essential (primary) hypertension: Secondary | ICD-10-CM | POA: Diagnosis not present

## 2020-08-08 DIAGNOSIS — K922 Gastrointestinal hemorrhage, unspecified: Secondary | ICD-10-CM

## 2020-08-08 HISTORY — DX: Hemorrhage of anus and rectum: K62.5

## 2020-08-08 LAB — APTT: aPTT: <24 seconds — ABNORMAL LOW (ref 24–36)

## 2020-08-08 LAB — URINALYSIS, COMPLETE (UACMP) WITH MICROSCOPIC
Bacteria, UA: NONE SEEN
Bilirubin Urine: NEGATIVE
Glucose, UA: NEGATIVE mg/dL
Hgb urine dipstick: NEGATIVE
Ketones, ur: NEGATIVE mg/dL
Leukocytes,Ua: NEGATIVE
Nitrite: NEGATIVE
Protein, ur: NEGATIVE mg/dL
Specific Gravity, Urine: 1.032 — ABNORMAL HIGH (ref 1.005–1.030)
pH: 6 (ref 5.0–8.0)

## 2020-08-08 LAB — PROTIME-INR
INR: 1 (ref 0.8–1.2)
Prothrombin Time: 13 seconds (ref 11.4–15.2)

## 2020-08-08 LAB — CBC
HCT: 38.7 % (ref 36.0–46.0)
Hemoglobin: 12.8 g/dL (ref 12.0–15.0)
MCH: 29.5 pg (ref 26.0–34.0)
MCHC: 33.1 g/dL (ref 30.0–36.0)
MCV: 89.2 fL (ref 80.0–100.0)
Platelets: 306 10*3/uL (ref 150–400)
RBC: 4.34 MIL/uL (ref 3.87–5.11)
RDW: 14.2 % (ref 11.5–15.5)
WBC: 7.8 10*3/uL (ref 4.0–10.5)
nRBC: 0 % (ref 0.0–0.2)

## 2020-08-08 LAB — RESP PANEL BY RT-PCR (FLU A&B, COVID) ARPGX2
Influenza A by PCR: NEGATIVE
Influenza B by PCR: NEGATIVE
SARS Coronavirus 2 by RT PCR: NEGATIVE

## 2020-08-08 LAB — HEMOGLOBIN AND HEMATOCRIT, BLOOD
HCT: 39 % (ref 36.0–46.0)
Hemoglobin: 12.4 g/dL (ref 12.0–15.0)

## 2020-08-08 LAB — HIV ANTIBODY (ROUTINE TESTING W REFLEX): HIV Screen 4th Generation wRfx: NONREACTIVE

## 2020-08-08 MED ORDER — LACTATED RINGERS IV SOLN: INTRAVENOUS | Status: DC

## 2020-08-08 MED ORDER — PANTOPRAZOLE SODIUM 40 MG PO TBEC
40.0000 mg | DELAYED_RELEASE_TABLET | Freq: Every day | ORAL | Status: DC
  Administered 2020-08-08 – 2020-08-09 (×2): 40 mg via ORAL
  Filled 2020-08-08 (×2): qty 1

## 2020-08-08 MED ORDER — ENOXAPARIN SODIUM 60 MG/0.6ML ~~LOC~~ SOLN
60.0000 mg | SUBCUTANEOUS | Status: DC
  Filled 2020-08-08: qty 0.6

## 2020-08-08 MED ORDER — MORPHINE SULFATE (PF) 4 MG/ML IV SOLN
4.0000 mg | INTRAVENOUS | Status: DC | PRN
  Administered 2020-08-08: 4 mg via INTRAVENOUS
  Filled 2020-08-08: qty 1

## 2020-08-08 MED ORDER — IOHEXOL 9 MG/ML PO SOLN
500.0000 mL | Freq: Two times a day (BID) | ORAL | Status: DC | PRN
  Filled 2020-08-08: qty 500

## 2020-08-08 MED ORDER — ACETAMINOPHEN 325 MG PO TABS
325.0000 mg | ORAL_TABLET | Freq: Four times a day (QID) | ORAL | Status: DC | PRN
  Administered 2020-08-09: 325 mg via ORAL
  Filled 2020-08-08: qty 1

## 2020-08-08 MED ORDER — IOHEXOL 300 MG/ML  SOLN
100.0000 mL | Freq: Once | INTRAMUSCULAR | Status: AC | PRN
  Administered 2020-08-08: 100 mL via INTRAVENOUS

## 2020-08-08 MED ORDER — ONDANSETRON HCL 4 MG/2ML IJ SOLN
4.0000 mg | Freq: Four times a day (QID) | INTRAMUSCULAR | Status: DC | PRN
  Administered 2020-08-08: 4 mg via INTRAVENOUS
  Filled 2020-08-08: qty 2

## 2020-08-08 MED ORDER — LOSARTAN POTASSIUM-HCTZ 50-12.5 MG PO TABS: 1.0000 | ORAL_TABLET | Freq: Every day | ORAL | Status: DC

## 2020-08-08 MED ORDER — LOSARTAN POTASSIUM 50 MG PO TABS
50.0000 mg | ORAL_TABLET | Freq: Every day | ORAL | Status: DC
  Administered 2020-08-08: 50 mg via ORAL
  Filled 2020-08-08: qty 1

## 2020-08-08 MED ORDER — HYDROCHLOROTHIAZIDE 12.5 MG PO CAPS
12.5000 mg | ORAL_CAPSULE | Freq: Every day | ORAL | Status: DC
  Administered 2020-08-08: 12.5 mg via ORAL
  Filled 2020-08-08: qty 1

## 2020-08-08 MED ORDER — ONDANSETRON HCL 4 MG PO TABS: 4.0000 mg | ORAL_TABLET | Freq: Four times a day (QID) | ORAL | Status: DC | PRN

## 2020-08-08 MED ORDER — FENTANYL CITRATE (PF) 100 MCG/2ML IJ SOLN
50.0000 ug | Freq: Once | INTRAMUSCULAR | Status: AC
  Administered 2020-08-08: 50 ug via INTRAVENOUS
  Filled 2020-08-08: qty 2

## 2020-08-08 MED ORDER — ACETAMINOPHEN 650 MG RE SUPP: 650.0000 mg | Freq: Four times a day (QID) | RECTAL | Status: DC | PRN

## 2020-08-08 NOTE — Consult Note (Signed)
Vonda Antigua, MD [address] [place], [place], [place], [address] [address], [place], [place], [place], [address] Phone: [phone]  Fax: [phone]  Consultation  Referring Provider:     Dr. Lupita Leash Primary Care Physician:  Leone Haven, MD Reason for Consultation:     [facility]  Date of Admission:  08/08/2020 Date of Consultation:  08/08/2020         HPI:   Heidi Hicks is a 59 y.o. female who started having diarrhea yesterday morning at home, with multiple loose bowel movements without any blood initially.  However, yesterday night around 9:30 PM she started having bright red blood per rectum, 1-2 episodes.  No further bowel movement since then.  Does have bilateral lower quadrant abdominal pain, dull, 5/10, nonradiating, unrelated to meals.  No prior history of similar symptoms.  Patient had a colonoscopy in 2019For polyp surveillance and this showed internal hemorrhoids and diverticulosis.  No nausea vomiting, hematemesis, dysphagia.  No prior upper endoscopy.  No family history of colon cancer.  No recent antibiotics use.  No fever or chills.  Past Medical History:  Diagnosis Date  . Arthritis   . Hypertension     Past Surgical History:  Procedure Laterality Date  . abkle fusion  1990  . BREAST SURGERY  2002   reduction  . broken ankle  1988  . [place]  . COLONOSCOPY    . GALLBLADDER SURGERY  2009  . POLYPECTOMY    . REDUCTION MAMMAPLASTY Bilateral 2002   anchor reduction scars  . TONSILLECTOMY AND ADENOIDECTOMY  1971    Prior to Admission medications   Medication Sig Start Date End Date Taking? Authorizing Provider  clobetasol cream (TEMOVATE) 0.05 % APPLY TO AFFECTED AREA TWICE A DAY 10/10/19  Yes Leone Haven, MD  ibuprofen (ADVIL,MOTRIN) 800 MG tablet Take 800 mg by mouth 3 (three) times daily. 04/02/18  Yes [provider]  losartan-hydrochlorothiazide (HYZAAR) 50-12.5 MG tablet TAKE 1 TABLET BY  MOUTH DAILY. *NEEDS APPT TO GET MORE REFILLS* 06/18/20  Yes Leone Haven, MD  omeprazole (PRILOSEC) 20 MG capsule TAKE 1 CAPSULE BY MOUTH EVERY DAY 06/18/20  Yes Einar Pheasant, MD  phentermine 37.5 MG capsule Take 37.5 mg by mouth every morning. Patient not taking: No sig reported    [provider]    Family History  Problem Relation Age of Onset  . Hypertension Mother   . Diabetes Mother   . Renal cancer Mother 76  . Hypertension Father   . Diabetes Father   . Diabetes Sister   . Colon polyps Neg Hx   . Esophageal cancer Neg Hx   . Rectal cancer Neg Hx   . Stomach cancer Neg Hx      Social History   Tobacco Use  . Smoking status: Never Smoker  . Smokeless tobacco: Never Used  Substance Use Topics  . Alcohol use: No  . Drug use: No    Allergies as of 08/07/2020 - Review Complete 08/07/2020  Allergen Reaction Noted  . Ace inhibitors Cough 12/29/2011    Review of Systems:    All systems reviewed and negative except where noted in HPI.   Physical Exam:  Vital signs in last 24 hours: Vitals:   08/08/20 0512 08/08/20 0730 08/08/20 1000 08/08/20 1100  BP: 129/82 118/73 117/67 111/69  Pulse: 71 76 79 76  Resp: 18 18 16 16   Temp: 98 F (36.7  C) 98 F (36.7 C)    TempSrc: Oral Oral    SpO2: 99% 100% 97% 99%  Weight:      Height:         General:   Pleasant, cooperative in NAD Head:  Normocephalic and atraumatic. Eyes:   No icterus.   Conjunctiva pink. PERRLA. Ears:  Normal auditory acuity. Neck:  Supple; no masses or thyroidomegaly Lungs: Respirations even and unlabored. Lungs clear to auscultation bilaterally.   No wheezes, crackles, or rhonchi.  Abdomen:  Soft, nondistended, nontender. Normal bowel sounds. No appreciable masses or hepatomegaly.  No rebound or guarding.  Neurologic:  Alert and oriented x3;  grossly normal neurologically. Skin:  Intact without significant lesions or rashes. Cervical Nodes:  No significant cervical  adenopathy. Psych:  Alert and cooperative. Normal affect.  LAB RESULTS: Recent Labs    08/07/20 2136 08/08/20 0955  WBC 9.0 7.8  HGB 13.1 12.8  HCT 41.9 38.7  PLT 347 306   BMET Recent Labs    08/07/20 2136  NA 138  K 3.8  CL 100  CO2 29  GLUCOSE 126*  BUN 17  CREATININE 0.98  CALCIUM 9.1   LFT Recent Labs    08/07/20 2136  PROT 7.8  ALBUMIN 3.9  AST 19  ALT 18  ALKPHOS 103  BILITOT 0.6   PT/INR Recent Labs    08/08/20 0404  LABPROT 13.0  INR 1.0    STUDIES: CT Abdomen Pelvis W Contrast  Result Date: 08/08/2020 CLINICAL DATA:  Abdominal pain and diarrhea EXAM: CT ABDOMEN AND PELVIS WITH CONTRAST TECHNIQUE: Multidetector CT imaging of the abdomen and pelvis was performed using the standard protocol following bolus administration of intravenous contrast. CONTRAST:  151mL OMNIPAQUE IOHEXOL 300 MG/ML  SOLN COMPARISON:  03/19/2007 FINDINGS: LOWER CHEST: Normal. HEPATOBILIARY: Normal hepatic contours. No intra- or extrahepatic biliary dilatation. Status post cholecystectomy. PANCREAS: Normal pancreas. No ductal dilatation or peripancreatic fluid collection. SPLEEN: Normal. ADRENALS/URINARY TRACT: The adrenal glands are normal. No hydronephrosis, nephroureterolithiasis or solid renal mass. The urinary bladder is normal for degree of distention STOMACH/BOWEL: There is no hiatal hernia. Normal duodenal course and caliber. No small bowel dilatation or inflammation. No focal colonic abnormality. Normal appendix. VASCULAR/LYMPHATIC: Normal course and caliber of the major abdominal vessels. No abdominal or pelvic lymphadenopathy. REPRODUCTIVE: Normal uterus. No adnexal mass. MUSCULOSKELETAL. No bony spinal canal stenosis or focal osseous abnormality. OTHER: None. IMPRESSION: No acute abnormality of the abdomen or pelvis. Electronically Signed   By: Ulyses Jarred M.D.   On: 08/08/2020 01:28      Impression / Plan:   Heidi Hicks is a 59 y.o. y/o female with diarrhea at  home yesterday initially not associated with any blood, followed by 1-2 episodes of bright red blood per rectum yesterday night and this has resolved since then  Patient's normal hemoglobin since admission is very reassuring  Symptoms likely due to infectious diarrhea  Patient has had 2 colonoscopies in her lifetime, most recent one about 2 years ago and therefore, unlikely to be from colon malignancy, IBD  Rectal blood per rectum likely due to her hemorrhoids as well  Would recommend ruling out infectious etiology with GI panel and C. Difficile  If bright red blood per rectum reoccurs or hemoglobin drops or any signs of active GI bleeding occur, please page GI for further evaluation.  Colonoscopy can be considered if necessary  No signs of upper GI bleeding  Okay to start clear liquid diet and advance as  tolerated  Thank you for involving me in the care of this patient.      LOS: 0 days   Virgel Manifold, MD  08/08/2020, 11:59 AM

## 2020-08-08 NOTE — ED Notes (Signed)
Transport request made 

## 2020-08-08 NOTE — ED Notes (Signed)
Rainbow and pink top sent to lab in triage

## 2020-08-08 NOTE — ED Notes (Signed)
Pt difficult IV stick.  Lab called for H & H

## 2020-08-08 NOTE — Progress Notes (Signed)
Anticoagulation monitoring(Lovenox):  59 yo female ordered Lovenox 40 mg Q24h  Filed Weights   08/07/20 2132  Weight: 121.6 kg (268 lb)   BMI 40.75    Lab Results  Component Value Date   CREATININE 0.98 08/07/2020   CREATININE 1.07 03/04/2019   CREATININE 1.09 04/18/2018   Estimated Creatinine Clearance: 85.9 mL/min (by C-G formula based on SCr of 0.98 mg/dL). Hemoglobin & Hematocrit     Component Value Date/Time   HGB 13.1 08/07/2020 2136   HCT 41.9 08/07/2020 2136     Per Protocol for Patient with estCrcl > 30 ml/min and BMI > 30, will transition to Lovenox 60 mg Q24h.

## 2020-08-08 NOTE — H&P (Signed)
History and Physical   Heidi Hicks EAV:[id number] DOB: 1960/12/17 DOA: 08/08/2020  PCP: Leone Haven, MD  Outpatient Specialists: None Patient coming from: Home  I have personally briefly reviewed patient's old medical records in [place].  Chief Concern: Abdominal pain  HPI: Heidi Hicks is a 59 y.o. female with medical history significant for hypertension, chronic left foot pain, presented to the emergency department for chief concerns of abdominal pain, diarrhea, and bright red blood per rectum.  Patient endorses that she had banana and peanut butter sandwich for breakfast at 8:45 AM, she experienced diarrhea around 11:30 AM, for 4-5 episodes of loose and then watery diarrhea that turned into blood. She reports she has never experienced this before.   She endorses nausea and denies vomiting. ROS was negative for headache, vision changes, dysphagia, odynophagia, chest pain, shortness of breath, dysuria, blood in urine, vaginal bleeding, dyspareunia, swelling in lower extremities, weakness in legs, ringing in ears.   Social history: lives with spouse and son. Works as an Medical illustrator. Denies smoking, etoh, recreational drug use.   Vaccination: she is vaccinated for covid, [employer] and then [employer] booster.   ED Course: Discussed with ED provider, requesting admission for observation for GI bleed.  Vital signs were stable afebrile on room air.  Labs are reassuring.  CT abdomen was negative.  Review of Systems: As per HPI otherwise 10 point review of systems negative.  Assessment/Plan  Active Problems:   BRBPR (bright red blood per rectum)   Bright red blood per rectum likely secondary to internal hemorrhoids -Colonoscopy in 2019 reported patient has internal hemorrhoids -CBC in a.m. -N.p.o. -Normal saline IVF  Hypertension-resumed losartan-hydrochlorothiazide  Chart reviewed.   DVT prophylaxis: TED hose Code Status: full code  Diet: N.p.o. Family  Communication: None Disposition Plan: Pending clinical course Consults called: None Admission status: Observation  Past Medical History:  Diagnosis Date  . Arthritis   . Hypertension    Past Surgical History:  Procedure Laterality Date  . abkle fusion  1990  . BREAST SURGERY  2002   reduction  . broken ankle  1988  . [place]  . COLONOSCOPY    . GALLBLADDER SURGERY  2009  . POLYPECTOMY    . REDUCTION MAMMAPLASTY Bilateral 2002   anchor reduction scars  . TONSILLECTOMY AND ADENOIDECTOMY  1971   Social History:  reports that she has never smoked. She has never used smokeless tobacco. She reports that she does not drink alcohol and does not use drugs.  Allergies  Allergen Reactions  . Ace Inhibitors Cough    No cough or complaints with Losartan.    Family History  Problem Relation Age of Onset  . Hypertension Mother   . Diabetes Mother   . Renal cancer Mother 32  . Hypertension Father   . Diabetes Father   . Diabetes Sister   . Colon polyps Neg Hx   . Esophageal cancer Neg Hx   . Rectal cancer Neg Hx   . Stomach cancer Neg Hx    Family history: Family history reviewed and not pertinent  Prior to Admission medications   Medication Sig Start Date End Date Taking? Authorizing Provider  clobetasol cream (TEMOVATE) 0.05 % APPLY TO AFFECTED AREA TWICE A DAY 10/10/19  Yes Leone Haven, MD  ibuprofen (ADVIL,MOTRIN) 800 MG tablet Take 800 mg by mouth 3 (three) times daily. 04/02/18  Yes [provider]  losartan-hydrochlorothiazide (HYZAAR) 50-12.5  MG tablet TAKE 1 TABLET BY MOUTH DAILY. *NEEDS APPT TO GET MORE REFILLS* 06/18/20  Yes Leone Haven, MD  omeprazole (PRILOSEC) 20 MG capsule TAKE 1 CAPSULE BY MOUTH EVERY DAY 06/18/20  Yes Einar Pheasant, MD  phentermine 37.5 MG capsule Take 37.5 mg by mouth every morning. Patient not taking: No sig reported    [provider]   Physical Exam: Vitals:   08/07/20 2132  08/08/20 0311  BP: 131/90 136/80  Pulse: 82 75  Resp: 20 20  Temp: 98.8 F (37.1 C)   TempSrc: Oral   SpO2: 100% 100%  Weight: 121.6 kg   Height: 5\' 8"  (1.727 m)    Constitutional: appears age-appropriate, NAD, calm, comfortable Eyes: PERRL, lids and conjunctivae normal ENMT: Mucous membranes are moist. Posterior pharynx clear of any exudate or lesions. Age-appropriate dentition. Hearing appropriate Neck: normal, supple, no masses, no thyromegaly Respiratory: clear to auscultation bilaterally, no wheezing, no crackles. Normal respiratory effort. No accessory muscle use.  Cardiovascular: Regular rate and rhythm, no murmurs / rubs / gallops. No extremity edema. 2+ pedal pulses. No carotid bruits.  Abdomen: Obese abdomen, no tenderness, no masses palpated, no hepatosplenomegaly. Bowel sounds positive.  Musculoskeletal: no clubbing / cyanosis. No joint deformity upper and lower extremities. Good ROM, no contractures, no atrophy. Normal muscle tone.  Skin: no rashes, lesions, ulcers. No induration Neurologic: Sensation intact. Strength 5/5 in all 4.  Psychiatric: Normal judgment and insight. Alert and oriented x 3. Normal mood.   EKG: Independently reviewed, showing normal sinus rhythm with a rate of 78, QTc 465  Chest x-ray on Admission: Personally reviewed and I agree with radiologist reading as below.  CT Abdomen Pelvis W Contrast  Result Date: 08/08/2020 CLINICAL DATA:  Abdominal pain and diarrhea EXAM: CT ABDOMEN AND PELVIS WITH CONTRAST TECHNIQUE: Multidetector CT imaging of the abdomen and pelvis was performed using the standard protocol following bolus administration of intravenous contrast. CONTRAST:  159mL OMNIPAQUE IOHEXOL 300 MG/ML  SOLN COMPARISON:  03/19/2007 FINDINGS: LOWER CHEST: Normal. HEPATOBILIARY: Normal hepatic contours. No intra- or extrahepatic biliary dilatation. Status post cholecystectomy. PANCREAS: Normal pancreas. No ductal dilatation or peripancreatic fluid  collection. SPLEEN: Normal. ADRENALS/URINARY TRACT: The adrenal glands are normal. No hydronephrosis, nephroureterolithiasis or solid renal mass. The urinary bladder is normal for degree of distention STOMACH/BOWEL: There is no hiatal hernia. Normal duodenal course and caliber. No small bowel dilatation or inflammation. No focal colonic abnormality. Normal appendix. VASCULAR/LYMPHATIC: Normal course and caliber of the major abdominal vessels. No abdominal or pelvic lymphadenopathy. REPRODUCTIVE: Normal uterus. No adnexal mass. MUSCULOSKELETAL. No bony spinal canal stenosis or focal osseous abnormality. OTHER: None. IMPRESSION: No acute abnormality of the abdomen or pelvis. Electronically Signed   By: Ulyses Jarred M.D.   On: 08/08/2020 01:28    Labs on Admission: I have personally reviewed following labs  CBC: Recent Labs  Lab 08/07/20 2136  WBC 9.0  HGB 13.1  HCT 41.9  MCV 89.5  PLT 026   Basic Metabolic Panel: Recent Labs  Lab 08/07/20 2136  NA 138  K 3.8  CL 100  CO2 29  GLUCOSE 126*  BUN 17  CREATININE 0.98  CALCIUM 9.1   GFR: Estimated Creatinine Clearance: 85.9 mL/min (by C-G formula based on SCr of 0.98 mg/dL). Liver Function Tests: Recent Labs  Lab 08/07/20 2136  AST 19  ALT 18  ALKPHOS 103  BILITOT 0.6  PROT 7.8  ALBUMIN 3.9   Recent Labs  Lab 08/07/20 2136  LIPASE 26  Urine analysis:    Component Value Date/Time   COLORURINE YELLOW (A) 08/08/2020 0120   APPEARANCEUR CLEAR (A) 08/08/2020 0120   LABSPEC 1.032 (H) 08/08/2020 0120   PHURINE 6.0 08/08/2020 0120   GLUCOSEU NEGATIVE 08/08/2020 0120   HGBUR NEGATIVE 08/08/2020 0120   BILIRUBINUR NEGATIVE 08/08/2020 0120   KETONESUR NEGATIVE 08/08/2020 0120   PROTEINUR NEGATIVE 08/08/2020 0120   NITRITE NEGATIVE 08/08/2020 0120   LEUKOCYTESUR NEGATIVE 08/08/2020 0120   Agueda Houpt N Ansley Stanwood D.O. [facility]  If 12AM-7AM, please contact overnight-coverage provider If 7AM-7PM, please contact day coverage  provider [email or web address]  08/08/2020, 4:21 AM

## 2020-08-08 NOTE — Progress Notes (Signed)
Patient seen and examined personally, I reviewed the chart, history and physical and admission note, done by admitting physician this morning and agree with the same with following addendum.  Please refer to the morning admission note for more detailed plan of care.  Briefly, 59 year old female with history of hypertension, chronic left foot pain comes to the ED for evaluation of multiple bowel movement with bloody stool abt 5x. She was seen in the ED CT abdomen was unremarkable and being admitted this morning for observation for GI bleeding.  On exam alert awake oriented.  No more bowel movement since coming to the ED. Abdomen soft nontender nondistended.  Issues: Multiple bowel movement with blood/BRBPR: Continue to monitor, serial H&H.  Last colonoscopy in 2019 has internal hemorrhoids could be the etiology.  Will ask GI to take a look.  Hypertension on losartan HCTZ.  Morbid obesity BMI 40.7 will benefit with weight loss and healthy lifestyle.  DVT prophylaxis add SCD, stop Lovenox due to #1

## 2020-08-08 NOTE — ED Notes (Signed)
Pt ambulatory to restroom with steady gait noted  

## 2020-08-08 NOTE — Plan of Care (Signed)

## 2020-08-08 NOTE — ED Provider Notes (Signed)
[facility] Emergency Department Provider Note  ____________________________________________  Time seen: Approximately 3:32 AM  I have reviewed the triage vital signs and the nursing notes.   HISTORY  Chief Complaint Abdominal Pain, Rectal Bleeding, and Diarrhea   HPI Heidi Hicks is a 59 y.o. female with a history of diverticular disease, hypertension, arthritis who presents for evaluation of rectal bleeding.  Patient reports abdominal pain and diarrhea that started this morning.  She reports 3 episodes of watery diarrhea followed by 2 episodes of bloody diarrhea.  She reports large amount of blood in the toilet.  She is not any blood thinners.  She denies dizziness, chest pain or shortness of breath.  She is complaining of diffuse cramping abdominal pain associated with it.  No nausea or vomiting.  No prior history of GI bleed.  Last episode of bloody diarrhea was prior to arrival.   Past Medical History:  Diagnosis Date  . Arthritis   . Hypertension     Patient Active Problem List   Diagnosis Date Noted  . BRBPR (bright red blood per rectum) 08/08/2020  . Psoriasis 10/10/2019  . Encounter for general adult medical examination with abnormal findings 10/10/2017  . Left ankle pain 05/05/2016  . Obesity 10/01/2013  . Arthralgia 02/04/2013  . Hypertension 12/29/2011    Past Surgical History:  Procedure Laterality Date  . abkle fusion  1990  . BREAST SURGERY  2002   reduction  . broken ankle  1988  . [place]  . COLONOSCOPY    . GALLBLADDER SURGERY  2009  . POLYPECTOMY    . REDUCTION MAMMAPLASTY Bilateral 2002   anchor reduction scars  . TONSILLECTOMY AND ADENOIDECTOMY  1971    Prior to Admission medications   Medication Sig Start Date End Date Taking? Authorizing Provider  clobetasol cream (TEMOVATE) 0.05 % APPLY TO AFFECTED AREA TWICE A DAY 10/10/19  Yes Leone Haven, MD  ibuprofen (ADVIL,MOTRIN) 800  MG tablet Take 800 mg by mouth 3 (three) times daily. 04/02/18  Yes [provider]  losartan-hydrochlorothiazide (HYZAAR) 50-12.5 MG tablet TAKE 1 TABLET BY MOUTH DAILY. *NEEDS APPT TO GET MORE REFILLS* 06/18/20  Yes Leone Haven, MD  omeprazole (PRILOSEC) 20 MG capsule TAKE 1 CAPSULE BY MOUTH EVERY DAY 06/18/20  Yes Einar Pheasant, MD  phentermine 37.5 MG capsule Take 37.5 mg by mouth every morning. Patient not taking: No sig reported    [provider]    Allergies Ace inhibitors  Family History  Problem Relation Age of Onset  . Hypertension Mother   . Diabetes Mother   . Renal cancer Mother 87  . Hypertension Father   . Diabetes Father   . Diabetes Sister   . Colon polyps Neg Hx   . Esophageal cancer Neg Hx   . Rectal cancer Neg Hx   . Stomach cancer Neg Hx     Social History Social History   Tobacco Use  . Smoking status: Never Smoker  . Smokeless tobacco: Never Used  Substance Use Topics  . Alcohol use: No  . Drug use: No    Review of Systems  Constitutional: Negative for fever. Eyes: Negative for visual changes. ENT: Negative for sore throat. Neck: No neck pain  Cardiovascular: Negative for chest pain. Respiratory: Negative for shortness of breath. Gastrointestinal: + abdominal pain, nausea, and bloody diarrhea. Genitourinary: Negative for dysuria. Musculoskeletal: Negative for back pain. Skin: Negative for rash. Neurological: Negative  for headaches, weakness or numbness. Psych: No SI or HI  ____________________________________________   PHYSICAL EXAM:  VITAL SIGNS: ED Triage Vitals [08/07/20 2132]  Enc Vitals Group     BP 131/90     Pulse Rate 82     Resp 20     Temp 98.8 F (37.1 C)     Temp Source Oral     SpO2 100 %     Weight 268 lb (121.6 kg)     Height 5\' 8"  (1.727 m)     Head Circumference      Peak Flow      Pain Score 6     Pain Loc      Pain Edu?      Excl. in [place]?     Constitutional: Alert and  oriented. Well appearing and in no apparent distress. HEENT:      Head: Normocephalic and atraumatic.         Eyes: Conjunctivae are normal. Sclera is non-icteric.       Mouth/Throat: Mucous membranes are moist.       Neck: Supple with no signs of meningismus. Cardiovascular: Regular rate and rhythm. No murmurs, gallops, or rubs. Respiratory: Normal respiratory effort. Lungs are clear to auscultation bilaterally.  Gastrointestinal: Soft, mild tenderness palpation on the left quadrants, and non distended with positive bowel sounds. No rebound or guarding. Genitourinary: External hemorrhoids with no signs of active bleeding, no anal fissure, rectal exam showing gross blood Musculoskeletal: No edema, cyanosis, or erythema of extremities. Neurologic: Normal speech and language. Face is symmetric. Moving all extremities. No gross focal neurologic deficits are appreciated. Skin: Skin is warm, dry and intact. No rash noted. Psychiatric: Mood and affect are normal. Speech and behavior are normal.  ____________________________________________   LABS (all labs ordered are listed, but only abnormal results are displayed)  Labs Reviewed  COMPREHENSIVE METABOLIC PANEL - Abnormal; Notable for the following components:      Result Value   Glucose, Bld 126 (*)    All other components within normal limits  URINALYSIS, COMPLETE (UACMP) WITH MICROSCOPIC - Abnormal; Notable for the following components:   Color, Urine YELLOW (*)    APPearance CLEAR (*)    Specific Gravity, Urine 1.032 (*)    All other components within normal limits  APTT - Abnormal; Notable for the following components:   aPTT <24 (*)    All other components within normal limits  RESP PANEL BY RT-PCR (FLU A&B, COVID) ARPGX2  LIPASE, BLOOD  CBC  PROTIME-INR  POC URINE PREG, ED  TYPE AND SCREEN   ____________________________________________  EKG  ED ECG REPORT I, Rudene Re, the attending physician, personally viewed  and interpreted this ECG.  Sinus rhythm, rate of 78, incomplete right bundle branch block, left axis deviation, no ST elevations or depressions.  No prior for comparison. ____________________________________________  RADIOLOGY  I have personally reviewed the images performed during this visit and I agree with the Radiologist's read.   Interpretation by Radiologist:  CT Abdomen Pelvis W Contrast  Result Date: 08/08/2020 CLINICAL DATA:  Abdominal pain and diarrhea EXAM: CT ABDOMEN AND PELVIS WITH CONTRAST TECHNIQUE: Multidetector CT imaging of the abdomen and pelvis was performed using the standard protocol following bolus administration of intravenous contrast. CONTRAST:  186mL OMNIPAQUE IOHEXOL 300 MG/ML  SOLN COMPARISON:  03/19/2007 FINDINGS: LOWER CHEST: Normal. HEPATOBILIARY: Normal hepatic contours. No intra- or extrahepatic biliary dilatation. Status post cholecystectomy. PANCREAS: Normal pancreas. No ductal dilatation or peripancreatic fluid collection. SPLEEN: Normal.  ADRENALS/URINARY TRACT: The adrenal glands are normal. No hydronephrosis, nephroureterolithiasis or solid renal mass. The urinary bladder is normal for degree of distention STOMACH/BOWEL: There is no hiatal hernia. Normal duodenal course and caliber. No small bowel dilatation or inflammation. No focal colonic abnormality. Normal appendix. VASCULAR/LYMPHATIC: Normal course and caliber of the major abdominal vessels. No abdominal or pelvic lymphadenopathy. REPRODUCTIVE: Normal uterus. No adnexal mass. MUSCULOSKELETAL. No bony spinal canal stenosis or focal osseous abnormality. OTHER: None. IMPRESSION: No acute abnormality of the abdomen or pelvis. Electronically Signed   By: Ulyses Jarred M.D.   On: 08/08/2020 01:28     ____________________________________________   PROCEDURES  Procedure(s) performed: yes .1-3 Lead EKG Interpretation Performed by: Rudene Re, MD Authorized by: Rudene Re, MD      Interpretation: non-specific     ECG rate assessment: normal     Rhythm: sinus rhythm     Ectopy: none     Critical Care performed:  None ____________________________________________   INITIAL IMPRESSION / ASSESSMENT AND PLAN / ED COURSE  59 y.o. female with a history of diverticular disease, hypertension, arthritis who presents for evaluation of rectal bleeding.  Patient reports 2 episodes of bloody diarrhea.  Rectal exam showing gross blood.  Old medical records reviewed showing colonoscopy done in 2019 which shows internal hemorrhoids and diverticular disease.  At this time she is hemodynamically stable with stable hemoglobin of 13.1.  CT visualized by me with no signs of acute diverticulitis to explain patient's pain, confirmed by radiology.  Patient received morphine in triage continues to complain of abdominal pain.  Will redose of fentanyl.  Due to concerns for ongoing lower GI bleed will discuss with the hospitalist for admission.  Patient is not on any blood thinners.  We will get coags and type and cross.  Patient placed on telemetry for close monitoring. Ddx diverticular bleed versus hemorrhoidal bleed versus brisk upper GI bleed.  Patient has no history of varices or cirrhosis.      _____________________________________________ Please note:  Patient was evaluated in Emergency Department today for the symptoms described in the history of present illness. Patient was evaluated in the context of the global COVID-19 pandemic, which necessitated consideration that the patient might be at risk for infection with the SARS-CoV-2 virus that causes COVID-19. Institutional protocols and algorithms that pertain to the evaluation of patients at risk for COVID-19 are in a state of rapid change based on information released by regulatory bodies including the CDC and federal and state organizations. These policies and algorithms were followed during the patient's care in the ED.  Some ED evaluations  and interventions may be delayed as a result of limited staffing during the pandemic.   [place] Controlled Substance Database was reviewed by me. ____________________________________________   FINAL CLINICAL IMPRESSION(S) / ED DIAGNOSES   Final diagnoses:  Lower GI bleed      NEW MEDICATIONS STARTED DURING THIS VISIT:  ED Discharge Orders    None       Note:  This document was prepared using Dragon voice recognition software and may include unintentional dictation errors.    Rudene Re, MD 08/08/20 [phone]

## 2020-08-08 NOTE — ED Notes (Signed)
Pt ambulated to bathroom without incident.  She reports that she did not have any stools or see any signs of blood.

## 2020-08-09 ENCOUNTER — Encounter: Payer: Self-pay | Admitting: Internal Medicine

## 2020-08-09 DIAGNOSIS — K625 Hemorrhage of anus and rectum: Secondary | ICD-10-CM | POA: Diagnosis not present

## 2020-08-09 LAB — HEMOGLOBIN AND HEMATOCRIT, BLOOD
HCT: 35.9 % — ABNORMAL LOW (ref 36.0–46.0)
HCT: 36.9 % (ref 36.0–46.0)
Hemoglobin: 11.6 g/dL — ABNORMAL LOW (ref 12.0–15.0)
Hemoglobin: 11.7 g/dL — ABNORMAL LOW (ref 12.0–15.0)

## 2020-08-09 NOTE — Progress Notes (Signed)
Discharge note:  Patient discharged to home today. Discharge instructions provided to patient. Verbalized understanding. IV removed. Driven home by husband. Wheeled to car by staff.  Ronnette Hila, RN

## 2020-08-09 NOTE — Discharge Summary (Signed)
Physician Discharge Summary  [name] TML:[id number] DOB: [birth year] DOA: 08/08/2020  PCP: Leone Haven, MD  Admit date: 08/08/2020 Discharge date: 08/09/2020  Admitted From: Home Disposition: Home  Recommendations for Outpatient Follow-up:  1. Follow up with PCP in 1-2 weeks 2. Please obtain BMP/CBC in one week 3. Please follow up on the following pending results:  Home Health: None Equipment/Devices: None  Discharge Condition: Stable Code Status:   Code Status: Full Code Diet recommendation:  Diet Order            DIET SOFT Room service appropriate? Yes; Fluid consistency: Thin  Diet effective now                  Brief/Interim Summary: 59 year old Hicks with history of hypertension, chronic left foot pain comes to the ED for evaluation of multiple bowel movement with bloody stool abt 5x. She was seen in the ED CT abdomen was unremarkable and being admitted this morning for observation for GI bleeding. Patient is admitted, seen by GI Hemoglobin was monitored with no significant drop.  No recurrence of bleeding.  Symptoms related to ? infectious diarrhea. Rectal blood per rectum likely due to hemorrhoids/diarrhea- no more diarrhea or bleeding. Tolerating po well. no abd pain, hb overall stable and will d/c home  Discharge Diagnoses:  Diarrhea/with rectal bleeding in the setting of hemorrhoids question infectious diarrhea.  No recurrence.  Tolerating diet.  Hemoglobin 11.6 g.  Last colonoscopy 2019 that showed internal hemorrhoids. Recent Labs  Lab 08/07/20 2136 08/08/20 0955 08/08/20 1838 08/09/20 0206 08/09/20 0952  HGB 13.1 12.8 12.4 11.6* 11.7*  HCT 41.9 38.7 39.0 35.9* 36.9   Hypertension on HCTZ/losartan resume outpatient Morbid obesity BMI 40.7 will benefit with weight loss strategy  Consults:  GI  Subjective: Aaox3, no bleeding nausea vomiting or abd pain  Discharge Exam: Vitals:   08/09/20 0507 08/09/20 0805  BP: 121/85 139/78   Pulse: 81 77  Resp: 16 16  Temp: 98.3 F (36.8 C) 97.8 F (36.6 C)  SpO2: 98% 100%   General: Pt is alert, awake, not in acute distress Cardiovascular: RRR, S1/S2 +, no rubs, no gallops Respiratory: CTA bilaterally, no wheezing, no rhonchi Abdominal: Soft, NT, ND, bowel sounds + Extremities: no edema, no cyanosis  Discharge Instructions  Discharge Instructions    Discharge instructions   Complete by: As directed    Please call call MD or return to ER for similar or worsening recurring problem that brought you to hospital or if any fever,nausea/vomiting,abdominal pain, uncontrolled pain, chest pain,  shortness of breath or any other alarming symptoms.  Please follow-up your doctor as instructed in a week time and call the office for appointment.  Please avoid alcohol, smoking, or any other illicit substance and maintain healthy habits including taking your regular medications as prescribed.  You were cared for by a hospitalist during your hospital stay. If you have any questions about your discharge medications or the care you received while you were in the hospital after you are discharged, you can call the unit and ask to speak with the hospitalist on call if the hospitalist that took care of you is not available.  Once you are discharged, your primary care physician will handle any further medical issues. Please note that NO REFILLS for any discharge medications will be authorized once you are discharged, as it is imperative that you return to your primary care physician (or establish a relationship with a primary care physician if you  do not have one) for your aftercare needs so that they can reassess your need for medications and monitor your lab values   Increase activity slowly   Complete by: As directed      Allergies as of 08/09/2020      Reactions   Ace Inhibitors Cough   No cough or complaints with Losartan.       Medication List    STOP taking these medications    ibuprofen 800 MG tablet Commonly known as: ADVIL     TAKE these medications   clobetasol cream 0.05 % Commonly known as: TEMOVATE APPLY TO AFFECTED AREA TWICE A DAY Notes to patient: None given in hospital   losartan-hydrochlorothiazide 50-12.5 MG tablet Commonly known as: HYZAAR TAKE 1 TABLET BY MOUTH DAILY. *NEEDS APPT TO GET MORE REFILLS* Notes to patient: None given in hospital   omeprazole 20 MG capsule Commonly known as: PRILOSEC TAKE 1 CAPSULE BY MOUTH EVERY DAY Notes to patient: None given in hospital   phentermine 37.5 MG capsule Take 37.5 mg by mouth every morning. Notes to patient: None given in hospital       Follow-up Information    Leone Haven, MD Follow up in 1 week(s).   Specialty: Family Medicine Contact information: [address] [place] [address] [phone]        Jerene Bears, MD. Call.   Specialty: Gastroenterology Contact information: [address] [address] [phone]              Allergies  Allergen Reactions  . Ace Inhibitors Cough    No cough or complaints with Losartan.     The results of significant diagnostics from this hospitalization (including imaging, microbiology, ancillary and laboratory) are listed below for reference.    Microbiology: Recent Results (from the past 240 hour(s))  Resp Panel by RT-PCR (Flu A&B, Covid) Nasopharyngeal Swab     Status: None   Collection Time: 08/08/20 12:09 AM   Specimen: Nasopharyngeal Swab; Nasopharyngeal(NP) swabs in vial transport medium  Result Value Ref Range Status   SARS Coronavirus 2 by RT PCR NEGATIVE NEGATIVE Final    Comment: (NOTE) SARS-CoV-2 target nucleic acids are NOT DETECTED.  The SARS-CoV-2 RNA is generally detectable in upper respiratory specimens during the acute phase of infection. The lowest concentration of SARS-CoV-2 viral copies this assay can detect is 138 copies/mL. A negative result does not preclude  SARS-Cov-2 infection and should not be used as the sole basis for treatment or other patient management decisions. A negative result may occur with  improper specimen collection/handling, submission of specimen other than nasopharyngeal swab, presence of viral mutation(s) within the areas targeted by this assay, and inadequate number of viral copies(<138 copies/mL). A negative result must be combined with clinical observations, patient history, and epidemiological information. The expected result is Negative.  Fact Sheet for Patients:  EntrepreneurPulse.com.au  Fact Sheet for Healthcare Providers:  IncredibleEmployment.be  This test is no t yet approved or cleared by the [place] FDA and  has been authorized for detection and/or diagnosis of SARS-CoV-2 by FDA under an Emergency Use Authorization (EUA). This EUA will remain  in effect (meaning this test can be used) for the duration of the COVID-19 declaration under Section 564(b)(1) of the Act, 21 U.S.C.section 360bbb-3(b)(1), unless the authorization is terminated  or revoked sooner.       Influenza A by PCR NEGATIVE NEGATIVE Final   Influenza B by PCR NEGATIVE NEGATIVE Final  Comment: (NOTE) The Xpert Xpress SARS-CoV-2/FLU/RSV plus assay is intended as an aid in the diagnosis of influenza from Nasopharyngeal swab specimens and should not be used as a sole basis for treatment. Nasal washings and aspirates are unacceptable for Xpert Xpress SARS-CoV-2/FLU/RSV testing.  Fact Sheet for Patients: EntrepreneurPulse.com.au  Fact Sheet for Healthcare Providers: IncredibleEmployment.be  This test is not yet approved or cleared by the [place] FDA and has been authorized for detection and/or diagnosis of SARS-CoV-2 by FDA under an Emergency Use Authorization (EUA). This EUA will remain in effect (meaning this test can be used) for the duration of  the COVID-19 declaration under Section 564(b)(1) of the Act, 21 U.S.C. section 360bbb-3(b)(1), unless the authorization is terminated or revoked.  Performed at [facility], [place], [place] [address]     Procedures/Studies: CT Abdomen Pelvis W Contrast  Result Date: 08/08/2020 CLINICAL DATA:  Abdominal pain and diarrhea EXAM: CT ABDOMEN AND PELVIS WITH CONTRAST TECHNIQUE: Multidetector CT imaging of the abdomen and pelvis was performed using the standard protocol following bolus administration of intravenous contrast. CONTRAST:  113mL OMNIPAQUE IOHEXOL 300 MG/ML  SOLN COMPARISON:  03/19/2007 FINDINGS: LOWER CHEST: Normal. HEPATOBILIARY: Normal hepatic contours. No intra- or extrahepatic biliary dilatation. Status post cholecystectomy. PANCREAS: Normal pancreas. No ductal dilatation or peripancreatic fluid collection. SPLEEN: Normal. ADRENALS/URINARY TRACT: The adrenal glands are normal. No hydronephrosis, nephroureterolithiasis or solid renal mass. The urinary bladder is normal for degree of distention STOMACH/BOWEL: There is no hiatal hernia. Normal duodenal course and caliber. No small bowel dilatation or inflammation. No focal colonic abnormality. Normal appendix. VASCULAR/LYMPHATIC: Normal course and caliber of the major abdominal vessels. No abdominal or pelvic lymphadenopathy. REPRODUCTIVE: Normal uterus. No adnexal mass. MUSCULOSKELETAL. No bony spinal canal stenosis or focal osseous abnormality. OTHER: None. IMPRESSION: No acute abnormality of the abdomen or pelvis. Electronically Signed   By: Ulyses Jarred M.D.   On: 08/08/2020 01:28    Labs: BNP (last 3 results) No results for input(s): BNP in the last 8760 hours. Basic Metabolic Panel: Recent Labs  Lab 08/07/20 2136  NA 138  K 3.8  CL 100  CO2 29  GLUCOSE 126*  BUN 17  CREATININE 0.98  CALCIUM 9.1   Liver Function Tests: Recent Labs  Lab 08/07/20 2136  AST 19  ALT 18  ALKPHOS 103  BILITOT  0.6  PROT 7.8  ALBUMIN 3.9   Recent Labs  Lab 08/07/20 2136  LIPASE 26   No results for input(s): AMMONIA in the last 168 hours. CBC: Recent Labs  Lab 08/07/20 2136 08/08/20 0955 08/08/20 1838 08/09/20 0206 08/09/20 0952  WBC 9.0 7.8  --   --   --   HGB 13.1 12.8 12.4 11.6* 11.7*  HCT 41.9 38.7 39.0 35.9* 36.9  MCV 89.5 89.2  --   --   --   PLT 347 306  --   --   --    Cardiac Enzymes: No results for input(s): CKTOTAL, CKMB, CKMBINDEX, TROPONINI in the last 168 hours. BNP: Invalid input(s): POCBNP CBG: No results for input(s): GLUCAP in the last 168 hours. D-Dimer No results for input(s): DDIMER in the last 72 hours. Hgb A1c No results for input(s): HGBA1C in the last 72 hours. Lipid Profile No results for input(s): CHOL, HDL, LDLCALC, TRIG, CHOLHDL, LDLDIRECT in the last 72 hours. Thyroid function studies No results for input(s): TSH, T4TOTAL, T3FREE, THYROIDAB in the last 72 hours.  Invalid input(s): FREET3 Anemia work up No results  for input(s): VITAMINB12, FOLATE, FERRITIN, TIBC, IRON, RETICCTPCT in the last 72 hours. Urinalysis    Component Value Date/Time   COLORURINE YELLOW (A) 08/08/2020 0120   APPEARANCEUR CLEAR (A) 08/08/2020 0120   LABSPEC 1.032 (H) 08/08/2020 0120   PHURINE 6.0 08/08/2020 0120   GLUCOSEU NEGATIVE 08/08/2020 0120   HGBUR NEGATIVE 08/08/2020 0120   BILIRUBINUR NEGATIVE 08/08/2020 0120   KETONESUR NEGATIVE 08/08/2020 0120   PROTEINUR NEGATIVE 08/08/2020 0120   NITRITE NEGATIVE 08/08/2020 0120   LEUKOCYTESUR NEGATIVE 08/08/2020 0120   Sepsis Labs Invalid input(s): PROCALCITONIN,  WBC,  LACTICIDVEN Microbiology Recent Results (from the past 240 hour(s))  Resp Panel by RT-PCR (Flu A&B, Covid) Nasopharyngeal Swab     Status: None   Collection Time: 08/08/20 12:09 AM   Specimen: Nasopharyngeal Swab; Nasopharyngeal(NP) swabs in vial transport medium  Result Value Ref Range Status   SARS Coronavirus 2 by RT PCR NEGATIVE NEGATIVE  Final    Comment: (NOTE) SARS-CoV-2 target nucleic acids are NOT DETECTED.  The SARS-CoV-2 RNA is generally detectable in upper respiratory specimens during the acute phase of infection. The lowest concentration of SARS-CoV-2 viral copies this assay can detect is 138 copies/mL. A negative result does not preclude SARS-Cov-2 infection and should not be used as the sole basis for treatment or other patient management decisions. A negative result may occur with  improper specimen collection/handling, submission of specimen other than nasopharyngeal swab, presence of viral mutation(s) within the areas targeted by this assay, and inadequate number of viral copies(<138 copies/mL). A negative result must be combined with clinical observations, patient history, and epidemiological information. The expected result is Negative.  Fact Sheet for Patients:  EntrepreneurPulse.com.au  Fact Sheet for Healthcare Providers:  IncredibleEmployment.be  This test is no t yet approved or cleared by the [place] FDA and  has been authorized for detection and/or diagnosis of SARS-CoV-2 by FDA under an Emergency Use Authorization (EUA). This EUA will remain  in effect (meaning this test can be used) for the duration of the COVID-19 declaration under Section 564(b)(1) of the Act, 21 U.S.C.section 360bbb-3(b)(1), unless the authorization is terminated  or revoked sooner.       Influenza A by PCR NEGATIVE NEGATIVE Final   Influenza B by PCR NEGATIVE NEGATIVE Final    Comment: (NOTE) The Xpert Xpress SARS-CoV-2/FLU/RSV plus assay is intended as an aid in the diagnosis of influenza from Nasopharyngeal swab specimens and should not be used as a sole basis for treatment. Nasal washings and aspirates are unacceptable for Xpert Xpress SARS-CoV-2/FLU/RSV testing.  Fact Sheet for Patients: EntrepreneurPulse.com.au  Fact Sheet for Healthcare  Providers: IncredibleEmployment.be  This test is not yet approved or cleared by the [place] FDA and has been authorized for detection and/or diagnosis of SARS-CoV-2 by FDA under an Emergency Use Authorization (EUA). This EUA will remain in effect (meaning this test can be used) for the duration of the COVID-19 declaration under Section 564(b)(1) of the Act, 21 U.S.C. section 360bbb-3(b)(1), unless the authorization is terminated or revoked.  Performed at [facility], [address] [place], [place] [address]      Time coordinating discharge:  25  minutes  SIGNED: Antonieta Pert, MD  [facility] 08/09/2020, 12:51 PM  If 7PM-7AM, please contact night-coverage [email or web address]

## 2020-08-12 ENCOUNTER — Telehealth: Payer: 59 | Admitting: Family Medicine

## 2020-08-13 ENCOUNTER — Ambulatory Visit (INDEPENDENT_AMBULATORY_CARE_PROVIDER_SITE_OTHER): Payer: 59

## 2020-08-13 ENCOUNTER — Other Ambulatory Visit: Payer: Self-pay | Admitting: Obstetrics and Gynecology

## 2020-08-13 ENCOUNTER — Encounter: Payer: Self-pay | Admitting: Obstetrics and Gynecology

## 2020-08-13 ENCOUNTER — Other Ambulatory Visit: Payer: Self-pay

## 2020-08-13 ENCOUNTER — Ambulatory Visit (INDEPENDENT_AMBULATORY_CARE_PROVIDER_SITE_OTHER): Payer: 59 | Admitting: Obstetrics and Gynecology

## 2020-08-13 VITALS — BP 124/70 | Ht 66.0 in | Wt 273.2 lb

## 2020-08-13 DIAGNOSIS — N950 Postmenopausal bleeding: Secondary | ICD-10-CM

## 2020-08-13 NOTE — Progress Notes (Signed)
Patient ID: Heidi Hicks, female   DOB: 01-Jun-1961, 59 y.o.   MRN: [id number]  Reason for Consult: Results ([place] results/)   Referred by Leone Haven, MD  Subjective:     HPI:  Heidi Hicks is a 59 y.o. female. She is following up today regarding postmenopausal bleeding. She reports she was also recently hospitalized with bloody bowel movements. She was upset about that admission. She feels like her concerns were not adequately addressed and she was discharged early. She had another bloody bowel movement this week. She has planned follow up with GI. She believes she has had small amounts of continued vaginal bleeding in addition to the bloody bowel movements.   She was uncomfortable with today's [place] and had bleeding in the folds of her abdomen where her prior abdominal incision is.   Past Medical History:  Diagnosis Date  . Arthritis   . Hypertension    Family History  Problem Relation Age of Onset  . Hypertension Mother   . Diabetes Mother   . Renal cancer Mother 14  . Hypertension Father   . Diabetes Father   . Diabetes Sister   . Colon polyps Neg Hx   . Esophageal cancer Neg Hx   . Rectal cancer Neg Hx   . Stomach cancer Neg Hx    Past Surgical History:  Procedure Laterality Date  . abkle fusion  1990  . BREAST SURGERY  2002   reduction  . broken ankle  1988  . [place]  . COLONOSCOPY    . GALLBLADDER SURGERY  2009  . POLYPECTOMY    . REDUCTION MAMMAPLASTY Bilateral 2002   anchor reduction scars  . TONSILLECTOMY AND ADENOIDECTOMY  1971    Short Social History:  Social History   Tobacco Use  . Smoking status: Never Smoker  . Smokeless tobacco: Never Used  Substance Use Topics  . Alcohol use: No    Allergies  Allergen Reactions  . Ace Inhibitors Cough    No cough or complaints with Losartan.     Current Outpatient Medications  Medication Sig Dispense Refill  . clobetasol cream (TEMOVATE) 0.05 % APPLY TO  AFFECTED AREA TWICE A DAY 15 g 1  . losartan-hydrochlorothiazide (HYZAAR) 50-12.5 MG tablet TAKE 1 TABLET BY MOUTH DAILY. *NEEDS APPT TO GET MORE REFILLS* 90 tablet 0  . omeprazole (PRILOSEC) 20 MG capsule TAKE 1 CAPSULE BY MOUTH EVERY DAY (Patient not taking: Reported on 08/13/2020) 90 capsule 0  . phentermine 37.5 MG capsule Take 37.5 mg by mouth every morning. (Patient not taking: No sig reported)     No current facility-administered medications for this visit.    Review of Systems  Constitutional: Negative for chills, fatigue, fever and unexpected weight change.  HENT: Negative for trouble swallowing.  Eyes: Negative for loss of vision.  Respiratory: Negative for cough, shortness of breath and wheezing.  Cardiovascular: Negative for chest pain, leg swelling, palpitations and syncope.  GI: Negative for abdominal pain, blood in stool, diarrhea, nausea and vomiting.  GU: Negative for difficulty urinating, dysuria, frequency and hematuria.  Musculoskeletal: Negative for back pain, leg pain and joint pain.  Skin: Negative for rash.  Neurological: Negative for dizziness, headaches, light-headedness, numbness and seizures.  Psychiatric: Negative for behavioral problem, confusion, depressed mood and sleep disturbance.        Objective:  Objective   Vitals:   08/13/20 1131  BP: 124/70  Weight: 273 lb 3.2  oz (123.9 kg)  Height: 5\' 6"  (1.676 m)   Body mass index is 44.1 kg/m.  Physical Exam Vitals and nursing note reviewed.  Constitutional:      Appearance: She is well-developed and well-nourished.  HENT:     Head: Normocephalic and atraumatic.  Eyes:     Extraocular Movements: EOM normal.     Pupils: Pupils are equal, round, and reactive to light.  Cardiovascular:     Rate and Rhythm: Normal rate and regular rhythm.  Pulmonary:     Effort: Pulmonary effort is normal. No respiratory distress.  Skin:    General: Skin is warm and dry.  Neurological:     Mental Status: She is  alert and oriented to person, place, and time.  Psychiatric:        Mood and Affect: Mood and affect normal.        Behavior: Behavior normal.        Thought Content: Thought content normal.        Judgment: Judgment normal.     Assessment/Plan:      59 yo with postmenopausal bleeding and thickened endometrium Offered EMB vs. Hysteroscopy D&C. She opted for hysteroscopy D&C.  Will schedule hysteroscopy D&C, patient also having rectal bleeding and follow up with GI has been planned.  Encouraged her to reach out to GI doctor via mychart with her concerns.   More than 20 minutes were spent face to face with the patient in the room, reviewing the medical record, labs and images, and coordinating care for the patient. The plan of management was discussed in detail and counseling was provided.     Adrian Prows MD [facility], [facility] 08/13/2020 12:26 PM

## 2020-08-13 NOTE — Progress Notes (Signed)
US results

## 2020-08-13 NOTE — Patient Instructions (Signed)
Hysteroscopy °Hysteroscopy is a procedure that is used to examine the inside of a woman's womb (uterus). This may be done for various reasons, including: °· To look for lumps (tumors) and other growths in the uterus. °· To evaluate abnormal bleeding, fibroid tumors, polyps, scar tissue (adhesions), or cancer of the uterus. °· To determine the cause of an inability to get pregnant (infertility) or repeated losses of pregnancies (miscarriages). °· To find a lost IUD (intrauterine device). °· To perform a procedure that permanently prevents pregnancy (sterilization). °During this procedure, a thin, flexible tube with a small light and camera (hysteroscope) is used to examine the uterus. The camera sends images to a monitor in the room so that your health care provider can view the inside of your uterus. A hysteroscopy should be done right after a menstrual period to make sure that you are not pregnant. °Tell a health care provider about: °· Any allergies you have. °· All medicines you are taking, including vitamins, herbs, eye drops, creams, and over-the-counter medicines. °· Any problems you or family members have had with the use of anesthetic medicines. °· Any blood disorders you have. °· Any surgeries you have had. °· Any medical conditions you have. °· Whether you are pregnant or may be pregnant. °What are the risks? °Generally, this is a safe procedure. However, problems may occur, including: °· Excessive bleeding. °· Infection. °· Damage to the uterus or other structures or organs. °· Allergic reaction to medicines or fluids that are used in the procedure. °What happens before the procedure? °Staying hydrated °Follow instructions from your health care provider about hydration, which may include: °· Up to 2 hours before the procedure - you may continue to drink clear liquids, such as water, clear fruit juice, black coffee, and plain tea. °Eating and drinking restrictions °Follow instructions from your health care  provider about eating and drinking, which may include: °· 8 hours before the procedure - stop eating solid foods and drink clear liquids only °· 2 hours before the procedure - stop drinking clear liquids. °General instructions °· Ask your health care provider about: °? Changing or stopping your normal medicines. This is important if you take diabetes medicines or blood thinners. °? Taking medicines such as aspirin and ibuprofen. These medicines can thin your blood and cause bleeding. Do not take these medicines for 1 week before your procedure, or as told by your health care provider. °· Do not use any products that contain nicotine or tobacco for 2 weeks before the procedure. This includes cigarettes and e-cigarettes. If you need help quitting, ask your health care provider. °· Medicine may be placed in your cervix the day before the procedure. This medicine causes the cervix to have a larger opening (dilate). The larger opening makes it easier for the hysteroscope to be inserted into the uterus during the procedure. °· Plan to have someone with you for the first 24-48 hours after the procedure, especially if you are given a medicine to make you fall asleep (general anesthetic). °· Plan to have someone take you home from the hospital or clinic. °What happens during the procedure? °· To lower your risk of infection: °? Your health care team will wash or sanitize their hands. °? Your skin will be washed with soap. °? Hair may be removed from the surgical area. °· An IV tube will be inserted into one of your veins. °· You may be given one or more of the following: °? A medicine to help   you relax (sedative). °? A medicine that numbs the area around the cervix (local anesthetic). °? A medicine to make you fall asleep (general anesthetic). °· A hysteroscope will be inserted through your vagina and into your uterus. °· Air or fluid will be used to enlarge your uterus, enabling your health care provider to see your uterus  better. The amount of fluid used will be carefully checked throughout the procedure. °· In some cases, tissue may be gently scraped from inside the uterus and sent to a lab for testing (biopsy). °The procedure may vary among health care providers and hospitals. °What happens after the procedure? °· Your blood pressure, heart rate, breathing rate, and blood oxygen level will be monitored until the medicines you were given have worn off. °· You may have some cramping. You may be given medicines for this. °· You may have bleeding, which varies from light spotting to menstrual-like bleeding. This is normal. °· If you had a biopsy done, it is your responsibility to get the results of your procedure. Ask your health care provider, or the department performing the procedure, when your results will be ready. °Summary °· Hysteroscopy is a procedure that is used to examine the inside of a woman's womb (uterus). °· After the procedure, you may have bleeding, which varies from light spotting to menstrual-like bleeding. This is normal. You may also have cramping. °· Plan to have someone take you home from the hospital or clinic. °This information is not intended to replace advice given to you by your health care provider. Make sure you discuss any questions you have with your health care provider. °Document Revised: 07/21/2017 Document Reviewed: 09/06/2016 °Elsevier Patient Education © 2020 [employer] ° °

## 2020-08-17 ENCOUNTER — Telehealth (INDEPENDENT_AMBULATORY_CARE_PROVIDER_SITE_OTHER): Payer: 59 | Admitting: Family Medicine

## 2020-08-17 ENCOUNTER — Encounter: Payer: Self-pay | Admitting: Family Medicine

## 2020-08-17 ENCOUNTER — Other Ambulatory Visit: Payer: Self-pay

## 2020-08-17 DIAGNOSIS — N950 Postmenopausal bleeding: Secondary | ICD-10-CM | POA: Insufficient documentation

## 2020-08-17 DIAGNOSIS — K625 Hemorrhage of anus and rectum: Secondary | ICD-10-CM | POA: Diagnosis not present

## 2020-08-17 DIAGNOSIS — M2550 Pain in unspecified joint: Secondary | ICD-10-CM | POA: Diagnosis not present

## 2020-08-17 HISTORY — DX: Postmenopausal bleeding: N95.0

## 2020-08-17 NOTE — Assessment & Plan Note (Signed)
She will complete work-up through GYN.

## 2020-08-17 NOTE — Assessment & Plan Note (Signed)
Possibly related to hemorrhoids or her diarrheal illness.  Certainly there could possibly be some other underlying cause.  At this point she has not had any additional bleeding and thus she will keep her outpatient appointment with GI.  We will plan to have her come in for a CBC and iron studies.

## 2020-08-17 NOTE — Assessment & Plan Note (Signed)
Chronic issues with this.  For now she will continue with Voltaren.  When she sees GI she will check with them to see what they say about her taking ibuprofen chronically.  The benefit to the patient may outweigh the risk particularly if she is on omeprazole chronically for gastric protection.

## 2020-08-17 NOTE — Progress Notes (Signed)
Virtual Visit via video Note  This visit type was conducted due to national recommendations for restrictions regarding the COVID-19 pandemic (e.g. social distancing).  This format is felt to be most appropriate for this patient at this time.  All issues noted in this document were discussed and addressed.  No physical exam was performed (except for noted visual exam findings with Video Visits).   I connected with Heidi Hicks today at  2:15 PM EST by a video enabled telemedicine application and verified that I am speaking with the correct person using two identifiers. Location patient: home Location provider: home office Persons participating in the virtual visit: patient, provider  I discussed the limitations, risks, security and privacy concerns of performing an evaluation and management service by telephone and the availability of in person appointments. I also discussed with the patient that there may be a patient responsible charge related to this service. The patient expressed understanding and agreed to proceed.  Reason for visit: f/u  HPI: GI bleed: Patient notes this was relatively sudden onset.  She had stomach cramping and diarrhea and then subsequently started to have bloody bowel movements.  She went to the ED for evaluation.  She had a CT scan that did not reveal a cause.  There is no significant change to her hemoglobin.  GI saw her and felt as though her bleeding was likely from hemorrhoids or infectious diarrhea.  No colonoscopy was completed.  She did not have another bowel movement until several days after discharge and and did have some blood in it at that time though her bowel movements have been normal without blood since then.  She has had no additional diarrhea.  She did note some stomach pain 1 day since discharge that was after she took ibuprofen on an empty stomach.  She has follow-up with GI scheduled.  Chronic arthralgias: Patient had been on ibuprofen for this with  good benefit.  She was also taking resolved for gastric protection.  She notes she was advised to discontinue the ibuprofen when she was in the hospital.  She messaged her orthopedic doctor and they noted she could try Tylenol in combination with Voltaren.  Tylenol has not been beneficial.  Voltaren does help a little bit.  Postmenopausal bleeding: She has seen GYN and they are planning to do a D&C.   ROS: See pertinent positives and negatives per HPI.  Past Medical History:  Diagnosis Date  . Arthritis   . Hypertension     Past Surgical History:  Procedure Laterality Date  . abkle fusion  1990  . BREAST SURGERY  2002   reduction  . broken ankle  1988  . [place]  . COLONOSCOPY    . GALLBLADDER SURGERY  2009  . POLYPECTOMY    . REDUCTION MAMMAPLASTY Bilateral 2002   anchor reduction scars  . TONSILLECTOMY AND ADENOIDECTOMY  1971    Family History  Problem Relation Age of Onset  . Hypertension Mother   . Diabetes Mother   . Renal cancer Mother 34  . Hypertension Father   . Diabetes Father   . Diabetes Sister   . Colon polyps Neg Hx   . Esophageal cancer Neg Hx   . Rectal cancer Neg Hx   . Stomach cancer Neg Hx     SOCIAL HX: Non-smoker   Current Outpatient Medications:  .  clobetasol cream (TEMOVATE) 0.05 %, APPLY TO AFFECTED AREA TWICE A DAY, Disp:  15 g, Rfl: 1 .  ferrous sulfate 325 (65 FE) MG tablet, Take 325 mg by mouth daily with breakfast., Disp: , Rfl:  .  losartan-hydrochlorothiazide (HYZAAR) 50-12.5 MG tablet, TAKE 1 TABLET BY MOUTH DAILY. *NEEDS APPT TO GET MORE REFILLS*, Disp: 90 tablet, Rfl: 0 .  omeprazole (PRILOSEC) 20 MG capsule, TAKE 1 CAPSULE BY MOUTH EVERY DAY, Disp: 90 capsule, Rfl: 0 .  phentermine 37.5 MG capsule, Take 37.5 mg by mouth every morning., Disp: , Rfl:   EXAM:  VITALS per patient if applicable:  GENERAL: alert, oriented, appears well and in no acute distress  HEENT: atraumatic, conjunttiva clear,  no obvious abnormalities on inspection of external nose and ears  NECK: normal movements of the head and neck  LUNGS: on inspection no signs of respiratory distress, breathing rate appears normal, no obvious gross SOB, gasping or wheezing  CV: no obvious cyanosis  MS: moves all visible extremities without noticeable abnormality  PSYCH/NEURO: pleasant and cooperative, no obvious depression or anxiety, speech and thought processing grossly intact  ASSESSMENT AND PLAN:  Discussed the following assessment and plan:  Problem List Items Addressed This Visit    Arthralgia    Chronic issues with this.  For now she will continue with Voltaren.  When she sees GI she will check with them to see what they say about her taking ibuprofen chronically.  The benefit to the patient may outweigh the risk particularly if she is on omeprazole chronically for gastric protection.      BRBPR (bright red blood per rectum)    Possibly related to hemorrhoids or her diarrheal illness.  Certainly there could possibly be some other underlying cause.  At this point she has not had any additional bleeding and thus she will keep her outpatient appointment with GI.  We will plan to have her come in for a CBC and iron studies.      Relevant Orders   CBC   IBC + Ferritin   Postmenopausal bleeding    She will complete work-up through GYN.          I discussed the assessment and treatment plan with the patient. The patient was provided an opportunity to ask questions and all were answered. The patient agreed with the plan and demonstrated an understanding of the instructions.   The patient was advised to call back or seek an in-person evaluation if the symptoms worsen or if the condition fails to improve as anticipated.   [name], MD

## 2020-08-17 NOTE — Assessment & Plan Note (Signed)
>>  ASSESSMENT AND PLAN FOR PAIN IN LEFT ANKLE AND JOINTS OF LEFT FOOT WRITTEN ON 08/17/2020  2:36 PM BY SONNENBERG, ERIC G, MD  Chronic issues with this.  For now she will continue with Voltaren.  When she sees GI she will check with them to see what they say about her taking ibuprofen  chronically.  The benefit to the patient may outweigh the risk particularly if she is on omeprazole  chronically for gastric protection.

## 2020-08-24 ENCOUNTER — Ambulatory Visit: Payer: 59

## 2020-08-24 ENCOUNTER — Ambulatory Visit: Payer: 59 | Admitting: Obstetrics and Gynecology

## 2020-08-26 ENCOUNTER — Ambulatory Visit: Payer: 59 | Admitting: Nurse Practitioner

## 2020-08-26 ENCOUNTER — Encounter: Payer: Self-pay | Admitting: Nurse Practitioner

## 2020-08-26 ENCOUNTER — Other Ambulatory Visit (INDEPENDENT_AMBULATORY_CARE_PROVIDER_SITE_OTHER): Payer: 59

## 2020-08-26 ENCOUNTER — Encounter: Payer: Self-pay | Admitting: Family Medicine

## 2020-08-26 VITALS — BP 118/74 | HR 79 | Ht 68.5 in | Wt 275.8 lb

## 2020-08-26 DIAGNOSIS — R1084 Generalized abdominal pain: Secondary | ICD-10-CM

## 2020-08-26 DIAGNOSIS — R197 Diarrhea, unspecified: Secondary | ICD-10-CM | POA: Diagnosis not present

## 2020-08-26 DIAGNOSIS — K625 Hemorrhage of anus and rectum: Secondary | ICD-10-CM

## 2020-08-26 LAB — CBC
HCT: 38.5 % (ref 36.0–46.0)
Hemoglobin: 12.7 g/dL (ref 12.0–15.0)
MCHC: 33 g/dL (ref 30.0–36.0)
MCV: 87.3 fl (ref 78.0–100.0)
Platelets: 358 10*3/uL (ref 150.0–400.0)
RBC: 4.41 Mil/uL (ref 3.87–5.11)
RDW: 14.7 % (ref 11.5–15.5)
WBC: 6.3 10*3/uL (ref 4.0–10.5)

## 2020-08-26 NOTE — Progress Notes (Signed)
Addendum: Reviewed and agree with assessment and management plan. Monterio Bob M, MD  

## 2020-08-26 NOTE — Progress Notes (Signed)
ASSESSMENT AND PLAN    # 60 yo female hospitalized at [facility] mid December with acute onset of nausea, generalized abdominal pain and diarrhea which was followed by two episodes of rectal bleeding. Reviewed records in [place]. Suspect infectious process with bleeding being perianal in nature and related to the diarrhea. Her symptoms have all resolved.  --Hgb declined from baseline of 13.1 to 11.7 but drop could have been dilutional after IV fluids. Will repeat hgb today -- Patient has a remote history of colon polyps but no polyps nor cancer on her last colonoscopy September 2019.  It was a complete exam with a good bowel prep. --Call us if any recurrent symptoms.   # NSAID dependent. Hospital told her to stop Ibuprofen but this would significantly reduce her quality of life with chronic ankle pain.   --We discussed NSAIDS in the context of GI complications. If she must take Ibuprofen then recommend taking Omeprazole 20 mg daily. Advised to stop NSAIDs and call us if she ever develops upper abdominal pain, N/V, or dark stools.      HISTORY OF PRESENT ILLNESS     Primary Gastroenterologist :Zenovia Jarred, MD  Chief Complaint : hospital follow up  Heidi Hicks is a 60 y.o. female with PMH / [place] significant for,  but not necessarily limited to: Hypertension, diverticulosis, history of colon   Patient was admitted to [facility] mid December with sudden onset nausea, diarrhea and generalized abdominal pain. No fevers.  After several episodes of diarrhea she began passing blood. She had two episodes of rectal bleeding prior to going to ED. No bleeding nor BMs after arriving to the hospital. In the hospital her WBC was normal. Initial hgb was 13.1 which is her baseline. Over the following few days it did decline to 11.7 but in absence of further bleeding. CT scan with contrast was unremarkable without bowel wall thickening.  In absence of further diarrhea stool studies were not collected. No  endoscopic workup was warranted.   Prior to the onset of symptoms patient had no contact with other with similars symptoms.  She had not started any new medications. Upon discharge she was advised her to stop ibuprofen Patient has chronic ankle problems and feels she would be unable to walk with taking Ibuprofen.  She takes 800 mg 1 to 2 times a day. She was advised to take omeprazole to protect her stomach. She is a little frustrated about being told to stop Ibuprofen since it is an essential medication for her.      Past Medical History:  Diagnosis Date  . Arthritis   . Hypertension     Current Medications, Allergies, Past Surgical History, Family History and Social History were reviewed in [employer] record.   Current Outpatient Medications  Medication Sig Dispense Refill  . clobetasol cream (TEMOVATE) 0.05 % APPLY TO AFFECTED AREA TWICE A DAY (Patient taking differently: Apply 1 application topically as needed (psoriasis).) 15 g 1  . ibuprofen (ADVIL) 800 MG tablet Take 800 mg by mouth every 8 (eight) hours as needed for mild pain.    Marland Kitchen losartan-hydrochlorothiazide (HYZAAR) 50-12.5 MG tablet TAKE 1 TABLET BY MOUTH DAILY. *NEEDS APPT TO GET MORE REFILLS* (Patient taking differently: Take 1 tablet by mouth daily.) 90 tablet 0  . Multiple Vitamins-Minerals (ADULT GUMMY PO) Take 2 capsules by mouth daily.    Marland Kitchen OVER THE COUNTER MEDICATION Take 1 tablet by mouth daily. Blood Builder otc supplement    .  omeprazole (PRILOSEC) 20 MG capsule TAKE 1 CAPSULE BY MOUTH EVERY DAY (Patient not taking: Reported on 08/26/2020) 90 capsule 0   No current facility-administered medications for this visit.    Review of Systems: No chest pain. No shortness of breath. No urinary complaints.   PHYSICAL EXAM :    Wt Readings from Last 3 Encounters:  08/26/20 275 lb 12.8 oz (125.1 kg)  08/17/20 270 lb (122.5 kg)  08/13/20 273 lb 3.2 oz (123.9 kg)    Pulse 79   Ht 5' 8.5" (1.74 m)    Wt 275 lb 12.8 oz (125.1 kg)   LMP 07/23/2011   SpO2 99%   BMI 41.33 kg/m  Constitutional:  Pleasant female in no acute distress. Psychiatric: Normal mood and affect. Behavior is normal. EENT: Pupils normal.  Conjunctivae are normal. No scleral icterus. Neck supple.  Cardiovascular: Normal rate, regular rhythm. No edema Pulmonary/chest: Effort normal and breath sounds normal. No wheezing, rales or rhonchi. Abdominal: Soft, nondistended, nontender. Bowel sounds active throughout. There are no masses palpable. No hepatomegaly. Neurological: Alert and oriented to person place and time. Skin: Skin is warm and dry. No rashes noted.  Heidi Cluster, NP  08/26/2020, 2:17 PM

## 2020-08-26 NOTE — Patient Instructions (Addendum)
If you are age 59 or older, your body mass index should be between 23-30. Your Body mass index is 41.33 kg/m. If this is out of the aforementioned range listed, please consider follow up with your Primary Care Provider.  If you are age 26 or younger, your body mass index should be between 19-25. Your Body mass index is 41.33 kg/m. If this is out of the aformentioned range listed, please consider follow up with your Primary Care Provider.   Continue Omeprazole 20 mg daily if you plan to resume use of Ibuprofen.  If you should develop nausea / vomiting / upper abdominal pain/ dark stools the stop Ibuprofen and call our office.  Follow up as needed.  Thank you for entrusting me with your care and choosing [facility].  Willette Cluster, NP-C

## 2020-08-27 ENCOUNTER — Other Ambulatory Visit: Payer: Self-pay

## 2020-08-27 DIAGNOSIS — K625 Hemorrhage of anus and rectum: Secondary | ICD-10-CM

## 2020-08-28 ENCOUNTER — Other Ambulatory Visit: Payer: Self-pay

## 2020-08-28 ENCOUNTER — Other Ambulatory Visit (INDEPENDENT_AMBULATORY_CARE_PROVIDER_SITE_OTHER): Payer: 59

## 2020-08-28 ENCOUNTER — Ambulatory Visit (INDEPENDENT_AMBULATORY_CARE_PROVIDER_SITE_OTHER): Payer: 59 | Admitting: Obstetrics and Gynecology

## 2020-08-28 ENCOUNTER — Encounter: Payer: Self-pay | Admitting: Obstetrics and Gynecology

## 2020-08-28 VITALS — BP 130/72 | Ht 68.5 in | Wt 276.4 lb

## 2020-08-28 DIAGNOSIS — K625 Hemorrhage of anus and rectum: Secondary | ICD-10-CM | POA: Diagnosis not present

## 2020-08-28 DIAGNOSIS — N950 Postmenopausal bleeding: Secondary | ICD-10-CM

## 2020-08-28 LAB — FERRITIN: Ferritin: 17.6 ng/mL (ref 10.0–291.0)

## 2020-08-28 NOTE — Progress Notes (Signed)
Post op  H&P

## 2020-08-28 NOTE — Patient Instructions (Signed)
Hysteroscopy °Hysteroscopy is a procedure that is used to examine the inside of a woman's womb (uterus). This may be done for various reasons, including: °· To look for lumps (tumors) and other growths in the uterus. °· To evaluate abnormal bleeding, fibroid tumors, polyps, scar tissue (adhesions), or cancer of the uterus. °· To determine the cause of an inability to get pregnant (infertility) or repeated losses of pregnancies (miscarriages). °· To find a lost IUD (intrauterine device). °· To perform a procedure that permanently prevents pregnancy (sterilization). °During this procedure, a thin, flexible tube with a small light and camera (hysteroscope) is used to examine the uterus. The camera sends images to a monitor in the room so that your health care provider can view the inside of your uterus. A hysteroscopy should be done right after a menstrual period to make sure that you are not pregnant. °Tell a health care provider about: °· Any allergies you have. °· All medicines you are taking, including vitamins, herbs, eye drops, creams, and over-the-counter medicines. °· Any problems you or family members have had with the use of anesthetic medicines. °· Any blood disorders you have. °· Any surgeries you have had. °· Any medical conditions you have. °· Whether you are pregnant or may be pregnant. °What are the risks? °Generally, this is a safe procedure. However, problems may occur, including: °· Excessive bleeding. °· Infection. °· Damage to the uterus or other structures or organs. °· Allergic reaction to medicines or fluids that are used in the procedure. °What happens before the procedure? °Staying hydrated °Follow instructions from your health care provider about hydration, which may include: °· Up to 2 hours before the procedure - you may continue to drink clear liquids, such as water, clear fruit juice, black coffee, and plain tea. °Eating and drinking restrictions °Follow instructions from your health care  provider about eating and drinking, which may include: °· 8 hours before the procedure - stop eating solid foods and drink clear liquids only °· 2 hours before the procedure - stop drinking clear liquids. °General instructions °· Ask your health care provider about: °? Changing or stopping your normal medicines. This is important if you take diabetes medicines or blood thinners. °? Taking medicines such as aspirin and ibuprofen. These medicines can thin your blood and cause bleeding. Do not take these medicines for 1 week before your procedure, or as told by your health care provider. °· Do not use any products that contain nicotine or tobacco for 2 weeks before the procedure. This includes cigarettes and e-cigarettes. If you need help quitting, ask your health care provider. °· Medicine may be placed in your cervix the day before the procedure. This medicine causes the cervix to have a larger opening (dilate). The larger opening makes it easier for the hysteroscope to be inserted into the uterus during the procedure. °· Plan to have someone with you for the first 24-48 hours after the procedure, especially if you are given a medicine to make you fall asleep (general anesthetic). °· Plan to have someone take you home from the hospital or clinic. °What happens during the procedure? °· To lower your risk of infection: °? Your health care team will wash or sanitize their hands. °? Your skin will be washed with soap. °? Hair may be removed from the surgical area. °· An IV tube will be inserted into one of your veins. °· You may be given one or more of the following: °? A medicine to help   you relax (sedative). °? A medicine that numbs the area around the cervix (local anesthetic). °? A medicine to make you fall asleep (general anesthetic). °· A hysteroscope will be inserted through your vagina and into your uterus. °· Air or fluid will be used to enlarge your uterus, enabling your health care provider to see your uterus  better. The amount of fluid used will be carefully checked throughout the procedure. °· In some cases, tissue may be gently scraped from inside the uterus and sent to a lab for testing (biopsy). °The procedure may vary among health care providers and hospitals. °What happens after the procedure? °· Your blood pressure, heart rate, breathing rate, and blood oxygen level will be monitored until the medicines you were given have worn off. °· You may have some cramping. You may be given medicines for this. °· You may have bleeding, which varies from light spotting to menstrual-like bleeding. This is normal. °· If you had a biopsy done, it is your responsibility to get the results of your procedure. Ask your health care provider, or the department performing the procedure, when your results will be ready. °Summary °· Hysteroscopy is a procedure that is used to examine the inside of a woman's womb (uterus). °· After the procedure, you may have bleeding, which varies from light spotting to menstrual-like bleeding. This is normal. You may also have cramping. °· Plan to have someone take you home from the hospital or clinic. °This information is not intended to replace advice given to you by your health care provider. Make sure you discuss any questions you have with your health care provider. °Document Revised: 07/21/2017 Document Reviewed: 09/06/2016 °Elsevier Patient Education © 2020 [employer] ° °

## 2020-08-28 NOTE — Progress Notes (Signed)
Patient ID: Heidi Hicks, female   DOB: 1961-02-27, 60 y.o.   MRN: [id number]  Reason for Consult: Pre-op Exam   Referred by Leone Haven, MD  Subjective:     HPI:  Heidi Hicks is a 60 y.o. female. She is here for a preoperative visit. She has been having some postmenopausal bleeding. She was able to follow up with her GI doctor.   Her pelvic US on 08/18/2020 showed a thickened endometrium of 11.7 mm.    Past Medical History:  Diagnosis Date  . Arthritis   . Hypertension    Family History  Problem Relation Age of Onset  . Hypertension Mother   . Diabetes Mother   . Renal cancer Mother 39  . Hypertension Father   . Diabetes Father   . Diabetes Sister   . Colon polyps Neg Hx   . Esophageal cancer Neg Hx   . Rectal cancer Neg Hx   . Stomach cancer Neg Hx   . Colon cancer Neg Hx   . Pancreatic cancer Neg Hx   . Liver disease Neg Hx    Past Surgical History:  Procedure Laterality Date  . abkle fusion  1990  . BREAST SURGERY  2002   reduction  . broken ankle  1988  . [place]  . COLONOSCOPY    . GALLBLADDER SURGERY  2009  . POLYPECTOMY    . REDUCTION MAMMAPLASTY Bilateral 2002   anchor reduction scars  . TONSILLECTOMY AND ADENOIDECTOMY  1971    Short Social History:  Social History   Tobacco Use  . Smoking status: Never Smoker  . Smokeless tobacco: Never Used  Substance Use Topics  . Alcohol use: No    Allergies  Allergen Reactions  . Ace Inhibitors Cough    No cough or complaints with Losartan.     Current Outpatient Medications  Medication Sig Dispense Refill  . ibuprofen (ADVIL) 800 MG tablet Take 800 mg by mouth every 8 (eight) hours as needed for mild pain.    Marland Kitchen losartan-hydrochlorothiazide (HYZAAR) 50-12.5 MG tablet TAKE 1 TABLET BY MOUTH DAILY. *NEEDS APPT TO GET MORE REFILLS* (Patient taking differently: Take 1 tablet by mouth daily.) 90 tablet 0  . Multiple Vitamins-Minerals (ADULT GUMMY PO) Take 2  capsules by mouth daily.    Marland Kitchen omeprazole (PRILOSEC) 20 MG capsule TAKE 1 CAPSULE BY MOUTH EVERY DAY 90 capsule 0  . OVER THE COUNTER MEDICATION Take 1 tablet by mouth daily. Blood Builder otc supplement    . clobetasol cream (TEMOVATE) 0.05 % APPLY TO AFFECTED AREA TWICE A DAY (Patient taking differently: Apply 1 application topically as needed (psoriasis).) 15 g 1   No current facility-administered medications for this visit.    Review of Systems  Constitutional: Negative for chills, fatigue, fever and unexpected weight change.  HENT: Negative for trouble swallowing.  Eyes: Negative for loss of vision.  Respiratory: Negative for cough, shortness of breath and wheezing.  Cardiovascular: Negative for chest pain, leg swelling, palpitations and syncope.  GI: Negative for abdominal pain, blood in stool, diarrhea, nausea and vomiting.  GU: Negative for difficulty urinating, dysuria, frequency and hematuria.  Musculoskeletal: Negative for back pain, leg pain and joint pain.  Skin: Negative for rash.  Neurological: Negative for dizziness, headaches, light-headedness, numbness and seizures.  Psychiatric: Negative for behavioral problem, confusion, depressed mood and sleep disturbance.        Objective:  Objective   Vitals:  08/28/20 1439  BP: 130/72  Weight: 276 lb 6.4 oz (125.4 kg)  Height: 5' 8.5" (1.74 m)   Body mass index is 41.42 kg/m.  Physical Exam Vitals and nursing note reviewed.  Constitutional:      Appearance: She is well-developed and well-nourished.  HENT:     Head: Normocephalic and atraumatic.  Eyes:     Extraocular Movements: EOM normal.     Pupils: Pupils are equal, round, and reactive to light.  Cardiovascular:     Rate and Rhythm: Normal rate and regular rhythm.  Pulmonary:     Effort: Pulmonary effort is normal. No respiratory distress.  Skin:    General: Skin is warm and dry.  Neurological:     Mental Status: She is alert and oriented to person,  place, and time.  Psychiatric:        Mood and Affect: Mood and affect normal.        Behavior: Behavior normal.        Thought Content: Thought content normal.        Judgment: Judgment normal.     Assessment/Plan:     60 yo with postmenopausal bleeding  Thickened endometrium on pelvic US, sampling has been recommended.  Hysteroscopy D&C planned for next week. Discussed risks of bleeding, damage to surrounding pelvic organs and  Infection. All questions answered.  More than 20 minutes were spent face to face with the patient in the room, reviewing the medical record, labs and images, and coordinating care for the patient. The plan of management was discussed in detail and counseling was provided.    Adrian Prows MD [facility] 08/28/2020 3:31 PM

## 2020-08-28 NOTE — H&P (View-Only) (Signed)
 Patient ID: Heidi Hicks, female   DOB: 02/12/1961, 59 y.o.   MRN: [id number]  Reason for Consult: Pre-op Exam   Referred by Sonnenberg, Eric G, MD  Subjective:     HPI:  Heidi Hicks is a 59 y.o. female. She is here for a preoperative visit. She has been having some postmenopausal bleeding. She was able to follow up with her GI doctor.   Her pelvic US on 08/18/2020 showed a thickened endometrium of 11.7 mm.    Past Medical History:  Diagnosis Date  . Arthritis   . Hypertension    Family History  Problem Relation Age of Onset  . Hypertension Mother   . Diabetes Mother   . Renal cancer Mother 81  . Hypertension Father   . Diabetes Father   . Diabetes Sister   . Colon polyps Neg Hx   . Esophageal cancer Neg Hx   . Rectal cancer Neg Hx   . Stomach cancer Neg Hx   . Colon cancer Neg Hx   . Pancreatic cancer Neg Hx   . Liver disease Neg Hx    Past Surgical History:  Procedure Laterality Date  . abkle fusion  1990  . BREAST SURGERY  2002   reduction  . broken ankle  1988  . CESAREAN SECTION     1981, 1989, 1997  . COLONOSCOPY    . GALLBLADDER SURGERY  2009  . POLYPECTOMY    . REDUCTION MAMMAPLASTY Bilateral 2002   anchor reduction scars  . TONSILLECTOMY AND ADENOIDECTOMY  1971    Short Social History:  Social History   Tobacco Use  . Smoking status: Never Smoker  . Smokeless tobacco: Never Used  Substance Use Topics  . Alcohol use: No    Allergies  Allergen Reactions  . Ace Inhibitors Cough    No cough or complaints with Losartan.     Current Outpatient Medications  Medication Sig Dispense Refill  . ibuprofen (ADVIL) 800 MG tablet Take 800 mg by mouth every 8 (eight) hours as needed for mild pain.    . losartan-hydrochlorothiazide (HYZAAR) 50-12.5 MG tablet TAKE 1 TABLET BY MOUTH DAILY. *NEEDS APPT TO GET MORE REFILLS* (Patient taking differently: Take 1 tablet by mouth daily.) 90 tablet 0  . Multiple Vitamins-Minerals (ADULT GUMMY PO) Take 2  capsules by mouth daily.    . omeprazole (PRILOSEC) 20 MG capsule TAKE 1 CAPSULE BY MOUTH EVERY DAY 90 capsule 0  . OVER THE COUNTER MEDICATION Take 1 tablet by mouth daily. Blood Builder otc supplement    . clobetasol cream (TEMOVATE) 0.05 % APPLY TO AFFECTED AREA TWICE A DAY (Patient taking differently: Apply 1 application topically as needed (psoriasis).) 15 g 1   No current facility-administered medications for this visit.    Review of Systems  Constitutional: Negative for chills, fatigue, fever and unexpected weight change.  HENT: Negative for trouble swallowing.  Eyes: Negative for loss of vision.  Respiratory: Negative for cough, shortness of breath and wheezing.  Cardiovascular: Negative for chest pain, leg swelling, palpitations and syncope.  GI: Negative for abdominal pain, blood in stool, diarrhea, nausea and vomiting.  GU: Negative for difficulty urinating, dysuria, frequency and hematuria.  Musculoskeletal: Negative for back pain, leg pain and joint pain.  Skin: Negative for rash.  Neurological: Negative for dizziness, headaches, light-headedness, numbness and seizures.  Psychiatric: Negative for behavioral problem, confusion, depressed mood and sleep disturbance.        Objective:  Objective   Vitals:     08/28/20 1439  BP: 130/72  Weight: 276 lb 6.4 oz (125.4 kg)  Height: 5' 8.5" (1.74 m)   Body mass index is 41.42 kg/m.  Physical Exam Vitals and nursing note reviewed.  Constitutional:      Appearance: She is well-developed and well-nourished.  HENT:     Head: Normocephalic and atraumatic.  Eyes:     Extraocular Movements: EOM normal.     Pupils: Pupils are equal, round, and reactive to light.  Cardiovascular:     Rate and Rhythm: Normal rate and regular rhythm.  Pulmonary:     Effort: Pulmonary effort is normal. No respiratory distress.  Skin:    General: Skin is warm and dry.  Neurological:     Mental Status: She is alert and oriented to person,  place, and time.  Psychiatric:        Mood and Affect: Mood and affect normal.        Behavior: Behavior normal.        Thought Content: Thought content normal.        Judgment: Judgment normal.     Assessment/Plan:     60 yo with postmenopausal bleeding  Thickened endometrium on pelvic US, sampling has been recommended.  Hysteroscopy D&C planned for next week. Discussed risks of bleeding, damage to surrounding pelvic organs and  Infection. All questions answered.  More than 20 minutes were spent face to face with the patient in the room, reviewing the medical record, labs and images, and coordinating care for the patient. The plan of management was discussed in detail and counseling was provided.    Adrian Prows MD [facility] 08/28/2020 3:31 PM

## 2020-08-29 LAB — IRON, TOTAL/TOTAL IRON BINDING CAP
%SAT: 20 % (calc) (ref 16–45)
Iron: 65 ug/dL (ref 45–160)
TIBC: 322 mcg/dL (calc) (ref 250–450)

## 2020-08-31 ENCOUNTER — Other Ambulatory Visit: Payer: Self-pay

## 2020-08-31 ENCOUNTER — Encounter
Admission: RE | Admit: 2020-08-31 | Discharge: 2020-08-31 | Disposition: A | Discharge status: Home / Self Care | Payer: 59 | Source: Ambulatory Visit | Attending: Obstetrics and Gynecology | Admitting: Obstetrics and Gynecology

## 2020-08-31 NOTE — Pre-Procedure Instructions (Signed)
EKG  ED ECG REPORT I, Rudene Re, the attending physician, personally viewed and interpreted this ECG.  Sinus rhythm, rate of 78, incomplete right bundle branch block, left axis deviation, no ST elevations or depressions.  No prior for comparison. ____________________________________________

## 2020-08-31 NOTE — Patient Instructions (Addendum)
Your procedure is scheduled on:09-03-20 THURSDAY Report to the Registration Desk on the 1st floor of the [facility]-Then proceed to the 2nd floor Surgery Desk in the [place] To find out your arrival time, please call [phone] between 1PM - 3PM on:09-02-20 WEDNESDAY  REMEMBER: Instructions that are not followed completely may result in serious medical risk, up to and including death; or upon the discretion of your surgeon and anesthesiologist your surgery may need to be rescheduled.  Do not eat food after midnight the night before surgery.  No gum chewing, lozengers or hard candies.  You may however, drink CLEAR liquids up to 2 hours before you are scheduled to arrive for your surgery. Do not drink anything within 2 hours of your scheduled arrival time.  Clear liquids include: - water  - apple juice without pulp - gatorade (not RED) - black coffee or tea (Do NOT add milk or creamers to the coffee or tea) Do NOT drink anything that is not on this list.  TAKE THESE MEDICATIONS THE MORNING OF SURGERY WITH A SIP OF WATER: -PRILOSEC (OMEPRAZOLE)-take one the night before and one on the morning of surgery - helps to prevent nausea after surgery.)  One week prior to surgery: Stop Anti-inflammatories (NSAIDS) such as Advil, Aleve, Ibuprofen, Motrin, Naproxen, Naprosyn and Aspirin based products such as Excedrin, Goodys Powder, BC Powder-OK TO TAKE TYLENOL IF NEEDED  Stop ANY OVER THE COUNTER supplements until after surgery-STOP YOUR BLOOD BUILDER SUPPLEMENT NOW-YOU MAY RESUME AFTER YOUR SURGERY (However, you may continue taking multivitamin up until the day before surgery.)  No Alcohol for 24 hours before or after surgery.  No Smoking including e-cigarettes for 24 hours prior to surgery.  No chewable tobacco products for at least 6 hours prior to surgery.  No nicotine patches on the day of surgery.  Do not use any "recreational" drugs for at least a week prior to your surgery.   Please be advised that the combination of cocaine and anesthesia may have negative outcomes, up to and including death. If you test positive for cocaine, your surgery will be cancelled.  On the morning of surgery brush your teeth with toothpaste and water, you may rinse your mouth with mouthwash if you wish. Do not swallow any toothpaste or mouthwash.  Do not wear jewelry, make-up, hairpins, clips or nail polish.  Do not wear lotions, powders, or perfumes.   Do not shave body from the neck down 48 hours prior to surgery just in case you cut yourself which could leave a site for infection.  Also, freshly shaved skin may become irritated if using the CHG soap.  Contact lenses, hearing aids and dentures may not be worn into surgery.  Do not bring valuables to the hospital. [facility] is not responsible for any missing/lost belongings or valuables.   Notify your doctor if there is any change in your medical condition (cold, fever, infection).  Wear comfortable clothing (specific to your surgery type) to the hospital.  Plan for stool softeners for home use; pain medications have a tendency to cause constipation. You can also help prevent constipation by eating foods high in fiber such as fruits and vegetables and drinking plenty of fluids as your diet allows.  After surgery, you can help prevent lung complications by doing breathing exercises.  Take deep breaths and cough every 1-2 hours. Your doctor may order a device called an Incentive Spirometer to help you take deep breaths. When coughing or sneezing, hold a  pillow firmly against your incision with both hands. This is called "splinting." Doing this helps protect your incision. It also decreases belly discomfort.  If you are being admitted to the hospital overnight, leave your suitcase in the car. After surgery it may be brought to your room.  If you are being discharged the day of surgery, you will not be allowed to drive home. You  will need a responsible adult (18 years or older) to drive you home and stay with you that night.   If you are taking public transportation, you will need to have a responsible adult (18 years or older) with you. Please confirm with your physician that it is acceptable to use public transportation.   Please call the [place] [facility] at [phone] if you have any questions about these instructions.  Visitation Policy:  Patients undergoing a surgery or procedure may have one family member or support person with them as long as that person is not COVID-19 positive or experiencing its symptoms.  That person may remain in the waiting area during the procedure.  Inpatient Visitation:    Visiting hours are 7 a.m. to 8 p.m. Patients will be allowed one visitor. The visitor may change daily. The visitor must pass COVID-19 screenings, use hand sanitizer when entering and exiting the patient's room and wear a mask at all times, including in the patient's room. Patients must also wear a mask when staff or their visitor are in the room. Masking is required regardless of vaccination status. Systemwide, no visitors 17 or younger.

## 2020-09-01 ENCOUNTER — Other Ambulatory Visit: Payer: Self-pay

## 2020-09-01 ENCOUNTER — Other Ambulatory Visit: Payer: 59

## 2020-09-01 ENCOUNTER — Encounter
Admission: RE | Admit: 2020-09-01 | Discharge: 2020-09-01 | Disposition: A | Discharge status: Home / Self Care | Payer: 59 | Source: Ambulatory Visit | Attending: Obstetrics and Gynecology | Admitting: Obstetrics and Gynecology

## 2020-09-01 DIAGNOSIS — Z01812 Encounter for preprocedural laboratory examination: Secondary | ICD-10-CM | POA: Diagnosis present

## 2020-09-01 DIAGNOSIS — Z20822 Contact with and (suspected) exposure to covid-19: Secondary | ICD-10-CM | POA: Insufficient documentation

## 2020-09-01 LAB — CBC
HCT: 39.6 % (ref 36.0–46.0)
Hemoglobin: 12.7 g/dL (ref 12.0–15.0)
MCH: 28.9 pg (ref 26.0–34.0)
MCHC: 32.1 g/dL (ref 30.0–36.0)
MCV: 90 fL (ref 80.0–100.0)
Platelets: 342 10*3/uL (ref 150–400)
RBC: 4.4 MIL/uL (ref 3.87–5.11)
RDW: 14.4 % (ref 11.5–15.5)
WBC: 5.2 10*3/uL (ref 4.0–10.5)
nRBC: 0 % (ref 0.0–0.2)

## 2020-09-01 LAB — TYPE AND SCREEN
ABO/RH(D): O POS
Antibody Screen: NEGATIVE

## 2020-09-02 LAB — SARS CORONAVIRUS 2 (TAT 6-24 HRS): SARS Coronavirus 2: NEGATIVE

## 2020-09-03 ENCOUNTER — Encounter
Admission: RE | Disposition: A | Discharge status: Home / Self Care | Payer: Self-pay | Source: Home / Self Care | Attending: Obstetrics and Gynecology

## 2020-09-03 ENCOUNTER — Encounter: Payer: Self-pay | Admitting: Obstetrics and Gynecology

## 2020-09-03 ENCOUNTER — Ambulatory Visit: Payer: 59 | Admitting: Urgent Care

## 2020-09-03 ENCOUNTER — Other Ambulatory Visit: Payer: Self-pay

## 2020-09-03 ENCOUNTER — Ambulatory Visit
Admission: RE | Admit: 2020-09-03 | Discharge: 2020-09-03 | Disposition: A | Discharge status: Home / Self Care | Payer: 59 | Attending: Obstetrics and Gynecology | Admitting: Obstetrics and Gynecology

## 2020-09-03 DIAGNOSIS — Z8051 Family history of malignant neoplasm of kidney: Secondary | ICD-10-CM | POA: Diagnosis not present

## 2020-09-03 DIAGNOSIS — Z833 Family history of diabetes mellitus: Secondary | ICD-10-CM | POA: Insufficient documentation

## 2020-09-03 DIAGNOSIS — N840 Polyp of corpus uteri: Secondary | ICD-10-CM | POA: Diagnosis not present

## 2020-09-03 DIAGNOSIS — Z8249 Family history of ischemic heart disease and other diseases of the circulatory system: Secondary | ICD-10-CM | POA: Insufficient documentation

## 2020-09-03 DIAGNOSIS — Z791 Long term (current) use of non-steroidal anti-inflammatories (NSAID): Secondary | ICD-10-CM | POA: Diagnosis not present

## 2020-09-03 DIAGNOSIS — R9389 Abnormal findings on diagnostic imaging of other specified body structures: Secondary | ICD-10-CM | POA: Insufficient documentation

## 2020-09-03 DIAGNOSIS — N950 Postmenopausal bleeding: Secondary | ICD-10-CM | POA: Diagnosis present

## 2020-09-03 DIAGNOSIS — Z79899 Other long term (current) drug therapy: Secondary | ICD-10-CM | POA: Diagnosis not present

## 2020-09-03 HISTORY — PX: HYSTEROSCOPY WITH D & C: SHX1775

## 2020-09-03 LAB — POCT PREGNANCY, URINE: Preg Test, Ur: NEGATIVE

## 2020-09-03 SURGERY — DILATATION AND CURETTAGE /HYSTEROSCOPY
Anesthesia: General

## 2020-09-03 MED ORDER — LACTATED RINGERS IV SOLN: INTRAVENOUS | Status: DC

## 2020-09-03 MED ORDER — PROPOFOL 10 MG/ML IV BOLUS
INTRAVENOUS | Status: DC | PRN
  Administered 2020-09-03: 200 mg via INTRAVENOUS

## 2020-09-03 MED ORDER — ORAL CARE MOUTH RINSE: 15.0000 mL | Freq: Once | OROMUCOSAL | Status: AC

## 2020-09-03 MED ORDER — SUGAMMADEX SODIUM 500 MG/5ML IV SOLN
INTRAVENOUS | Status: DC | PRN
  Administered 2020-09-03: 400 mg via INTRAVENOUS

## 2020-09-03 MED ORDER — FENTANYL CITRATE (PF) 100 MCG/2ML IJ SOLN
INTRAMUSCULAR | Status: DC | PRN
  Administered 2020-09-03 (×3): 50 ug via INTRAVENOUS

## 2020-09-03 MED ORDER — FENTANYL CITRATE (PF) 100 MCG/2ML IJ SOLN
INTRAMUSCULAR | Status: AC
  Filled 2020-09-03: qty 2

## 2020-09-03 MED ORDER — LIDOCAINE HCL (CARDIAC) PF 100 MG/5ML IV SOSY
PREFILLED_SYRINGE | INTRAVENOUS | Status: DC | PRN
  Administered 2020-09-03: 100 mg via INTRAVENOUS

## 2020-09-03 MED ORDER — TRAMADOL HCL 50 MG PO TABS
50.0000 mg | ORAL_TABLET | Freq: Four times a day (QID) | ORAL | 0 refills | Status: AC | PRN
Start: 2020-09-03 — End: 2021-09-03

## 2020-09-03 MED ORDER — ACETAMINOPHEN 10 MG/ML IV SOLN
INTRAVENOUS | Status: AC
  Filled 2020-09-03: qty 100

## 2020-09-03 MED ORDER — MIDAZOLAM HCL 2 MG/2ML IJ SOLN
INTRAMUSCULAR | Status: AC
  Filled 2020-09-03: qty 2

## 2020-09-03 MED ORDER — MIDAZOLAM HCL 2 MG/2ML IJ SOLN
INTRAMUSCULAR | Status: DC | PRN
  Administered 2020-09-03: 2 mg via INTRAVENOUS

## 2020-09-03 MED ORDER — FENTANYL CITRATE (PF) 100 MCG/2ML IJ SOLN: 25.0000 ug | INTRAMUSCULAR | Status: DC | PRN

## 2020-09-03 MED ORDER — GLYCOPYRROLATE 0.2 MG/ML IJ SOLN
INTRAMUSCULAR | Status: DC | PRN
  Administered 2020-09-03: .2 mg via INTRAVENOUS

## 2020-09-03 MED ORDER — DEXMEDETOMIDINE (PRECEDEX) IN NS 20 MCG/5ML (4 MCG/ML) IV SYRINGE
PREFILLED_SYRINGE | INTRAVENOUS | Status: DC | PRN
  Administered 2020-09-03: 8 ug via INTRAVENOUS

## 2020-09-03 MED ORDER — POVIDONE-IODINE 10 % EX SWAB
2.0000 "application " | Freq: Once | CUTANEOUS | Status: AC
  Administered 2020-09-03: 2 via TOPICAL

## 2020-09-03 MED ORDER — OXYCODONE HCL 5 MG PO TABS: 5.0000 mg | ORAL_TABLET | Freq: Once | ORAL | Status: DC | PRN

## 2020-09-03 MED ORDER — KETOROLAC TROMETHAMINE 30 MG/ML IJ SOLN
INTRAMUSCULAR | Status: DC | PRN
  Administered 2020-09-03: 30 mg via INTRAVENOUS

## 2020-09-03 MED ORDER — SUCCINYLCHOLINE CHLORIDE 20 MG/ML IJ SOLN
INTRAMUSCULAR | Status: DC | PRN
  Administered 2020-09-03: 140 mg via INTRAVENOUS

## 2020-09-03 MED ORDER — DEXAMETHASONE SODIUM PHOSPHATE 10 MG/ML IJ SOLN
INTRAMUSCULAR | Status: DC | PRN
  Administered 2020-09-03: 10 mg via INTRAVENOUS

## 2020-09-03 MED ORDER — ACETAMINOPHEN 10 MG/ML IV SOLN
INTRAVENOUS | Status: DC | PRN
  Administered 2020-09-03: 1000 mg via INTRAVENOUS

## 2020-09-03 MED ORDER — CHLORHEXIDINE GLUCONATE 0.12 % MT SOLN
OROMUCOSAL | Status: AC
  Filled 2020-09-03: qty 15

## 2020-09-03 MED ORDER — CHLORHEXIDINE GLUCONATE 0.12 % MT SOLN
15.0000 mL | Freq: Once | OROMUCOSAL | Status: AC
  Administered 2020-09-03: 15 mL via OROMUCOSAL

## 2020-09-03 MED ORDER — LIDOCAINE HCL (PF) 2 % IJ SOLN
INTRAMUSCULAR | Status: AC
  Filled 2020-09-03: qty 5

## 2020-09-03 MED ORDER — ROCURONIUM BROMIDE 100 MG/10ML IV SOLN
INTRAVENOUS | Status: DC | PRN
  Administered 2020-09-03: 10 mg via INTRAVENOUS
  Administered 2020-09-03: 40 mg via INTRAVENOUS

## 2020-09-03 MED ORDER — ONDANSETRON HCL 4 MG/2ML IJ SOLN
INTRAMUSCULAR | Status: DC | PRN
  Administered 2020-09-03: 4 mg via INTRAVENOUS

## 2020-09-03 MED ORDER — OXYCODONE HCL 5 MG/5ML PO SOLN: 5.0000 mg | Freq: Once | ORAL | Status: DC | PRN

## 2020-09-03 SURGICAL SUPPLY — 19 items
CATH ROBINSON RED A/P 16FR (CATHETERS) ×2 IMPLANT
DEVICE MYOSURE LITE (MISCELLANEOUS) IMPLANT
DEVICE MYOSURE REACH (MISCELLANEOUS) ×2 IMPLANT
ELECT REM PT RETURN 9FT ADLT (ELECTROSURGICAL)
ELECTRODE REM PT RTRN 9FT ADLT (ELECTROSURGICAL) IMPLANT
GAUZE 4X4 16PLY RFD (DISPOSABLE) ×2 IMPLANT
GLOVE SURG SYN 6.5 ES PF (GLOVE) ×6 IMPLANT
GLOVE SURG UNDER POLY LF SZ6.5 (GLOVE) ×6 IMPLANT
GOWN STRL REUS W/ TWL LRG LVL3 (GOWN DISPOSABLE) ×2 IMPLANT
GOWN STRL REUS W/TWL LRG LVL3 (GOWN DISPOSABLE) ×4
KIT PROCEDURE FLUENT (KITS) IMPLANT
PACK DNC HYST (MISCELLANEOUS) ×2 IMPLANT
PAD OB MATERNITY 4.3X12.25 (PERSONAL CARE ITEMS) ×2 IMPLANT
PAD PREP 24X41 OB/GYN DISP (PERSONAL CARE ITEMS) ×2 IMPLANT
SEAL ROD LENS SCOPE MYOSURE (ABLATOR) ×2 IMPLANT
SOL .9 NS 3000ML IRR  AL (IV SOLUTION) ×1
SOL .9 NS 3000ML IRR AL (IV SOLUTION) ×1
SOL .9 NS 3000ML IRR UROMATIC (IV SOLUTION) ×1 IMPLANT
TOWEL OR 17X26 4PK STRL BLUE (TOWEL DISPOSABLE) ×2 IMPLANT

## 2020-09-03 NOTE — Discharge Instructions (Signed)
Dilation and Curettage or Vacuum Curettage, Care After This sheet gives you information about how to care for yourself after your procedure. Your doctor may also give you more specific instructions. If you have problems or questions, contact your doctor. What can I expect after the procedure? After the procedure, it is common to have:  Mild pain or cramping.  Some bleeding or spotting from the vagina. These may last for up to 2 weeks. Follow these instructions at home: Medicines  Take over-the-counter and prescription medicines only as told by your doctor. This is very important if you take blood-thinning medicine.  Ask your doctor if the medicine prescribed to you requires you to avoid driving or using machinery. Activity  If you were given a medicine to help you relax (sedative) during your procedure, it can affect you for many hours. Do not drive or use machinery until your doctor says that it is safe.  Rest as told by your doctor.  Do not sit for a long time without moving. Get up to take short walks every 1-2 hours. This is important. Ask for help if you feel weak or unsteady.  Do not lift anything that is heavier than 10 lb (4.5 kg), or the limit that you are told, until your doctor says that it is safe.  Return to your normal activities as told by your doctor. Ask your doctor what activities are safe for you.   Lifestyle For at least 2 weeks, or as long as told by your doctor:  Do not douche.  Do not use tampons.  Do not have sex. General instructions  Wear compression stockings as told by your doctor.  It is up to you to get the results of your procedure. Ask your doctor, or the department that is doing the procedure, when your results will be ready.  Keep all follow-up visits as told by your doctor. This is important. Contact a doctor if:  You have very bad cramps that get worse or do not get better with medicine.  You have very bad pain in your belly  (abdomen).  You cannot drink fluids without vomiting.  You have pain in a different part of your pelvis. The pelvis is the area just above your thighs.  You have fluid from your vagina that smells bad.  You have a rash. Get help right away if:  You are bleeding a lot from your vagina. A lot of bleeding means soaking more than one sanitary pad in 1 hour for 2 hours in a row.  You have a fever that is above 100.31F (38.0C).  Your belly feels very tender or hard.  You have chest pain.  You have trouble breathing.  You feel dizzy.  You feel light-headed.  You pass out (faint).  You have pain in your neck or shoulder area. These symptoms may be an emergency. Do not wait to see if the symptoms will go away. Get medical help right away. Call your local emergency services (911 in the [place] Do not drive yourself to the hospital. Summary  After your procedure, it is common to have pain or cramping. It is also common to have bleeding or spotting from your vagina.  Rest as told. Do not sit for a long time without moving. Get up to take short walks every 1-2 hours.  Do not lift anything that is heavier than 10 lb (4.5 kg), or the limit that you are told.  Contact your doctor if you have fluid from  your vagina that smells bad.  Get help right away if you develop any problems from the procedure. Ask your doctor what problems to watch for. This information is not intended to replace advice given to you by your health care provider. Make sure you discuss any questions you have with your health care provider. Document Revised: 09/10/2019 Document Reviewed: 09/10/2019 Elsevier Patient Education  2021 [employer]   AMBULATORY SURGERY  DISCHARGE INSTRUCTIONS   1) The drugs that you were given will stay in your system until tomorrow so for the next 24 hours you should not:  A) Drive an automobile B) Make any legal decisions C) Drink any alcoholic beverage   2) You may resume  regular meals tomorrow.  Today it is better to start with liquids and gradually work up to solid foods.  You may eat anything you prefer, but it is better to start with liquids, then soup and crackers, and gradually work up to solid foods.   3) Please notify your doctor immediately if you have any unusual bleeding, trouble breathing, redness and pain at the surgery site, drainage, fever, or pain not relieved by medication.    4) Additional Instructions:    Please contact your physician with any problems or Same Day Surgery at [phone], Monday through Friday 6 am to 4 pm, or [facility] [place] [facility] number at [phone]. 

## 2020-09-03 NOTE — Transfer of Care (Signed)
Immediate Anesthesia Transfer of Care Note  Patient: Heidi Hicks  Procedure(s) Performed: DILATATION AND CURETTAGE /HYSTEROSCOPY, polypectomy (N/A )  Patient Location: PACU  Anesthesia Type:General  Level of Consciousness: drowsy, patient cooperative and responds to stimulation  Airway & Oxygen Therapy: Patient Spontanous Breathing and Patient connected to face mask oxygen  Post-op Assessment: Report given to RN and Post -op Vital signs reviewed and stable  Post vital signs: Reviewed and stable  Last Vitals:  Vitals Value Taken Time  BP 106/73 09/03/20 1055  Temp 35.8 C 09/03/20 1054  Pulse 78 09/03/20 1101  Resp 16 09/03/20 1054  SpO2 99 % 09/03/20 1101  Vitals shown include unvalidated device data.  Last Pain:  Vitals:   09/03/20 1054  TempSrc:   PainSc: Asleep         Complications: No complications documented.

## 2020-09-03 NOTE — Interval H&P Note (Signed)
History and Physical Interval Note:  09/03/2020 9:10 AM  Heidi Hicks  has presented today for surgery, with the diagnosis of postmenopausal bleeding thickened endometrium.  The various methods of treatment have been discussed with the patient and family. After consideration of risks, benefits and other options for treatment, the patient has consented to  Procedure(s): DILATATION AND CURETTAGE /HYSTEROSCOPY (N/A) as a surgical intervention.  The patient's history has been reviewed, patient examined, no change in status, stable for surgery.  I have reviewed the patient's chart and labs.  Questions were answered to the patient's satisfaction.     [place]

## 2020-09-03 NOTE — Anesthesia Preprocedure Evaluation (Signed)
Anesthesia Evaluation  Patient identified by MRN, date of birth, ID band Patient awake    Reviewed: Allergy & Precautions, H&P , NPO status , Patient's Chart, lab work & pertinent test results  History of Anesthesia Complications Negative for: history of anesthetic complications  Airway Mallampati: III  TM Distance: >3 FB Neck ROM: full    Dental  (+) Chipped   Pulmonary neg pulmonary ROS, neg shortness of breath,    Pulmonary exam normal        Cardiovascular Exercise Tolerance: Good hypertension, (-) angina(-) Past MI and (-) DOE Normal cardiovascular exam     Neuro/Psych negative neurological ROS  negative psych ROS   GI/Hepatic negative GI ROS, Neg liver ROS, neg GERD  ,  Endo/Other  Morbid obesity  Renal/GU      Musculoskeletal  (+) Arthritis ,   Abdominal   Peds  Hematology negative hematology ROS (+)   Anesthesia Other Findings Past Medical History: No date: Arthritis No date: Hypertension  Past Surgical History: 1990: abkle fusion 2002: BREAST SURGERY     Comment:  reduction 1988: broken ankle No date: CESAREAN SECTION     Comment:  [place] No date: COLONOSCOPY 2009: GALLBLADDER SURGERY No date: POLYPECTOMY 2002: REDUCTION MAMMAPLASTY; Bilateral     Comment:  anchor reduction scars 1971: TONSILLECTOMY AND ADENOIDECTOMY     Reproductive/Obstetrics negative OB ROS                             Anesthesia Physical Anesthesia Plan  ASA: III  Anesthesia Plan: General ETT   Post-op Pain Management:    Induction: Intravenous  PONV Risk Score and Plan: Ondansetron, Dexamethasone, Midazolam and Treatment may vary due to age or medical condition  Airway Management Planned: Oral ETT  Additional Equipment:   Intra-op Plan:   Post-operative Plan: Extubation in OR  Informed Consent: I have reviewed the patients History and Physical, chart, labs and  discussed the procedure including the risks, benefits and alternatives for the proposed anesthesia with the patient or authorized representative who has indicated his/her understanding and acceptance.     Dental Advisory Given  Plan Discussed with: Anesthesiologist, CRNA and Surgeon  Anesthesia Plan Comments: (Patient consented for risks of anesthesia including but not limited to:  - adverse reactions to medications - damage to eyes, teeth, lips or other oral mucosa - nerve damage due to positioning  - sore throat or hoarseness - Damage to heart, brain, nerves, lungs, other parts of body or loss of life  Patient voiced understanding.)        Anesthesia Quick Evaluation

## 2020-09-03 NOTE — Anesthesia Postprocedure Evaluation (Signed)
Anesthesia Post Note  Patient: Lenard Simmer  Procedure(s) Performed: DILATATION AND CURETTAGE /HYSTEROSCOPY, polypectomy (N/A )  Patient location during evaluation: PACU Anesthesia Type: General Level of consciousness: awake and alert Pain management: pain level controlled Vital Signs Assessment: post-procedure vital signs reviewed and stable Respiratory status: spontaneous breathing, nonlabored ventilation and respiratory function stable Cardiovascular status: blood pressure returned to baseline and stable Postop Assessment: no apparent nausea or vomiting Anesthetic complications: no   No complications documented.   Last Vitals:  Vitals:   09/03/20 1136 09/03/20 1141  BP: 99/71 (!) 105/59  Pulse:  69  Resp: 16 16  Temp: (!) 36.1 C (!) 36.1 C  SpO2:  96%    Last Pain:  Vitals:   09/03/20 1141  TempSrc:   PainSc: 2                  Alphonsus Sias

## 2020-09-03 NOTE — Anesthesia Procedure Notes (Signed)
Procedure Name: Intubation Performed by: Fletcher-Harrison, Euva Rundell, CRNA Pre-anesthesia Checklist: Patient identified, Emergency Drugs available, Suction available and Patient being monitored Patient Re-evaluated:Patient Re-evaluated prior to induction Oxygen Delivery Method: Circle system utilized Preoxygenation: Pre-oxygenation with 100% oxygen Induction Type: IV induction Ventilation: Mask ventilation without difficulty Tube type: Oral Tube size: 7.0 mm Number of attempts: 1 Airway Equipment and Method: Stylet and Oral airway Placement Confirmation: ETT inserted through vocal cords under direct vision,  positive ETCO2,  breath sounds checked- equal and bilateral and CO2 detector Secured at: 21 cm Tube secured with: Tape Dental Injury: Teeth and Oropharynx as per pre-operative assessment        

## 2020-09-03 NOTE — Op Note (Signed)
Operative Note  09/03/2020  PRE-OP DIAGNOSIS: Postmenopausal bleeding, thickened endometrium  POST-OP DIAGNOSIS: same   SURGEON: Christanna Schuman MD  PROCEDURE: Procedure(s): DILATATION AND CURETTAGE /HYSTEROSCOPY, POLYPECTOMY  ANESTHESIA: Choice   ESTIMATED BLOOD LOSS: 5 cc   SPECIMENS:  Endometrial poly and endometrial curettings  FLUID DEFICIT: minimal  COMPLICATIONS: None  DISPOSITION: PACU - hemodynamically stable.  CONDITION: stable  FINDINGS: Exam under anesthesia revealed  13 cm uterus with bilateral adnexa without masses or fullness. Hysteroscopy revealed endocervical and endometrial polyp in the uterine cavity with bilateral tubal ostia and normal appearing endocervical canal.  PROCEDURE IN DETAIL: After informed consent was obtained, the patient was taken to the operating room where anesthesia was obtained without difficulty. The patient was positioned in the dorsal lithotomy position in [place]. The patient's bladder was catheterized with an in and out foley catheter. The patient was examined under anesthesia, with the above noted findings. The weightedspeculum was placed inside the patient's vagina, and the the anterior lip of the cervix was seen and grasped with the tenaculum.  The uterine cavity was sounded to 13cm, and then the cervix was progressively dilated to a 18 French-Pratt dilator. The 0 degree hysteroscope was introduced, with saline fluid used to distend the intrauterine cavity, with the above noted findings.  The Myosure was used to remove the uterine polyps. Once the cavity was sampled entirely and polyps removed the hysteroscope was removed.   The uterine cavity was curetted until a gritty texture was noted, yielding endometrial curettings. Excellent hemostasis was noted, and all instruments were removed, with excellent hemostasis noted throughout. She was then taken out of dorsal lithotomy. Minimal discrepancy in fluid was noted.  The patient  tolerated the procedure well. Sponge, lap and needle counts were correct x2. The patient was taken to recovery room in excellent condition.  Adrian Prows MD [facility], [facility] 09/03/2020 10:48 AM

## 2020-09-04 LAB — SURGICAL PATHOLOGY

## 2020-09-10 ENCOUNTER — Other Ambulatory Visit: Payer: Self-pay | Admitting: Internal Medicine

## 2020-09-11 ENCOUNTER — Other Ambulatory Visit: Payer: Self-pay

## 2020-09-11 ENCOUNTER — Ambulatory Visit (INDEPENDENT_AMBULATORY_CARE_PROVIDER_SITE_OTHER): Payer: 59 | Admitting: Obstetrics and Gynecology

## 2020-09-11 ENCOUNTER — Encounter: Payer: Self-pay | Admitting: Obstetrics and Gynecology

## 2020-09-11 VITALS — BP 124/72 | Ht 68.5 in | Wt 279.0 lb

## 2020-09-11 DIAGNOSIS — N840 Polyp of corpus uteri: Secondary | ICD-10-CM

## 2020-09-11 DIAGNOSIS — N950 Postmenopausal bleeding: Secondary | ICD-10-CM

## 2020-09-11 NOTE — Progress Notes (Signed)
POST OP follow up

## 2020-09-11 NOTE — Progress Notes (Signed)
  Postoperative Follow-up Patient presents post op from operative hysteroscopy for abnormal uterine bleeding, 1 week ago.  Subjective: Patient reports marked improvement in her preop symptoms. Eating a regular diet without difficulty. The patient is not having any pain.  Activity: normal activities of daily living. Patient reports additional symptom's since surgery of None.  Objective: BP 124/72   Ht 5' 8.5" (1.74 m)   Wt 279 lb (126.6 kg)   LMP 07/23/2011   BMI 41.80 kg/m  Physical Exam Constitutional:      Appearance: Normal appearance.  HENT:     Head: Normocephalic and atraumatic.     Nose: Nose normal.  Eyes:     Extraocular Movements: Extraocular movements intact.     Pupils: Pupils are equal, round, and reactive to light.  Cardiovascular:     Rate and Rhythm: Normal rate.  Pulmonary:     Effort: Pulmonary effort is normal.  Abdominal:     General: Abdomen is flat.     Palpations: Abdomen is soft.  Musculoskeletal:     Cervical back: Normal range of motion.  Neurological:     General: No focal deficit present.     Mental Status: She is alert and oriented to person, place, and time.  Skin:    General: Skin is warm and dry.     Assessment: s/p :  operative hysteroscopy stable  Plan: Patient has done well after surgery with no apparent complications.  I have discussed the post-operative course to date, and the expected progress moving forward.  The patient understands what complications to be concerned about.  I will see the patient in routine follow up, or sooner if needed.    Activity plan: No restriction.   Tasheika Kitzmiller R Anay Walter 09/11/2020, 11:28 AM

## 2020-09-29 ENCOUNTER — Other Ambulatory Visit: Payer: Self-pay | Admitting: Family Medicine

## 2020-09-29 DIAGNOSIS — I10 Essential (primary) hypertension: Secondary | ICD-10-CM

## 2020-10-20 ENCOUNTER — Encounter: Payer: 59 | Admitting: Family Medicine

## 2020-11-11 ENCOUNTER — Encounter: Payer: 59 | Admitting: Family Medicine

## 2020-12-02 ENCOUNTER — Encounter: Payer: Self-pay | Admitting: Family Medicine

## 2020-12-02 ENCOUNTER — Ambulatory Visit (INDEPENDENT_AMBULATORY_CARE_PROVIDER_SITE_OTHER): Payer: 59 | Admitting: Family Medicine

## 2020-12-02 ENCOUNTER — Other Ambulatory Visit: Payer: Self-pay

## 2020-12-02 VITALS — BP 118/80 | HR 70 | Temp 98.2°F | Ht 68.0 in | Wt 280.2 lb

## 2020-12-02 DIAGNOSIS — Z1231 Encounter for screening mammogram for malignant neoplasm of breast: Secondary | ICD-10-CM

## 2020-12-02 DIAGNOSIS — Z0000 Encounter for general adult medical examination without abnormal findings: Secondary | ICD-10-CM | POA: Diagnosis not present

## 2020-12-02 DIAGNOSIS — I10 Essential (primary) hypertension: Secondary | ICD-10-CM

## 2020-12-02 DIAGNOSIS — Z6841 Body Mass Index (BMI) 40.0 and over, adult: Secondary | ICD-10-CM

## 2020-12-02 LAB — COMPREHENSIVE METABOLIC PANEL
ALT: 13 U/L (ref 0–35)
AST: 15 U/L (ref 0–37)
Albumin: 3.8 g/dL (ref 3.5–5.2)
Alkaline Phosphatase: 95 U/L (ref 39–117)
BUN: 16 mg/dL (ref 6–23)
CO2: 29 mEq/L (ref 19–32)
Calcium: 9.1 mg/dL (ref 8.4–10.5)
Chloride: 103 mEq/L (ref 96–112)
Creatinine, Ser: 1.13 mg/dL (ref 0.40–1.20)
GFR: 53.55 mL/min — ABNORMAL LOW (ref >60.00)
Glucose, Bld: 82 mg/dL (ref 70–99)
Potassium: 3.9 mEq/L (ref 3.5–5.1)
Sodium: 140 mEq/L (ref 135–145)
Total Bilirubin: 0.5 mg/dL (ref 0.2–1.2)
Total Protein: 6.8 g/dL (ref 6.0–8.3)

## 2020-12-02 LAB — LIPID PANEL
Cholesterol: 146 mg/dL (ref 0–200)
HDL: 49.6 mg/dL (ref >39.00)
LDL Cholesterol: 79 mg/dL (ref 0–99)
NonHDL: 95.98
Total CHOL/HDL Ratio: 3
Triglycerides: 87 mg/dL (ref 0.0–149.0)
VLDL: 17.4 mg/dL (ref 0.0–40.0)

## 2020-12-02 LAB — HEMOGLOBIN A1C: Hgb A1c MFr Bld: 6.1 % (ref 4.6–6.5)

## 2020-12-02 MED ORDER — OMEPRAZOLE 20 MG PO CPDR: DELAYED_RELEASE_CAPSULE | ORAL | 3 refills | Status: DC

## 2020-12-02 MED ORDER — IBUPROFEN 600 MG PO TABS: 600.0000 mg | ORAL_TABLET | Freq: Three times a day (TID) | ORAL | 0 refills | Status: DC | PRN

## 2020-12-02 MED ORDER — LOSARTAN POTASSIUM-HCTZ 50-12.5 MG PO TABS: ORAL_TABLET | ORAL | 3 refills | Status: DC

## 2020-12-02 MED ORDER — OMEPRAZOLE 20 MG PO CPDR
DELAYED_RELEASE_CAPSULE | ORAL | 3 refills | Status: DC
Start: 2020-12-02 — End: 2020-12-02

## 2020-12-02 NOTE — Patient Instructions (Signed)
Nice to see you. Please call [phone] to schedule your mammogram. Please continue with exercise. Please try to cut down on your sugar intake. If you would like the Shingrix vaccine through our office please let us know. We will check lab work.

## 2020-12-02 NOTE — Assessment & Plan Note (Addendum)
Physical exam completed.  Encouraged continued exercise.  Discussed healthy diet with less sugar intake.  Pap smear and colonoscopy are up-to-date.  The patient will call to schedule her mammogram.  Breast exam and pelvic exam deferred given that she follows with GYN.  Discussed Shingrix vaccine and she will check with her insurance regarding coverage.  Lab work as outlined.

## 2020-12-02 NOTE — Addendum Note (Signed)
Addended by: Fulton Mole D on: 12/02/2020 10:37 AM   Modules accepted: Orders

## 2020-12-02 NOTE — Progress Notes (Signed)
Heidi Rumps, MD Phone: [phone]  Heidi Hicks is a 60 y.o. female who presents today for CPE.  Diet: The patient has tried to eat more vegan, she is eating vegan meat replacements, not much soda or sweet tea.  She does note she eats too many sugary foods. Exercise: She has been doing the elliptical 4 days a week for 30 minutes at a time. Pap smear: 07/13/2020 NILM neg HPV recently saw GYN and had a D&C that revealed a polyp Colonoscopy: 05/07/2018 with 10-year recall Mammogram: Due Family history-  Colon cancer: no  Breast cancer: no  Ovarian cancer: no Menses: recently evaluated by GYN for postmenopausal bleeding Vaccines-   Flu: UTD  Tetanus: UTD  Shingles: due  COVID19: UTD HIV screening: UTD Hep C Screening: UTD Tobacco use: no Alcohol use: no Illicit Drug use: no Dentist: yes Ophthalmology: yes   Active Ambulatory Problems    Diagnosis Date Noted  . Hypertension 12/29/2011  . Arthralgia 02/04/2013  . Obesity 10/01/2013  . Left ankle pain 05/05/2016  . Routine general medical examination at a health care facility 10/10/2017  . Psoriasis 10/10/2019  . BRBPR (bright red blood per rectum) 08/08/2020  . Postmenopausal bleeding 08/17/2020   Resolved Ambulatory Problems    Diagnosis Date Noted  . Dermatophytosis of nail 12/29/2011  . Obesity (BMI 30-39.9) 01/30/2012  . Medication management 10/01/2013  . Acute frontal sinusitis 08/11/2015  . Acute bronchitis 12/07/2015  . Viral upper respiratory illness 01/04/2017   Past Medical History:  Diagnosis Date  . Arthritis     Family History  Problem Relation Age of Onset  . Hypertension Mother   . Diabetes Mother   . Renal cancer Mother 67  . Hypertension Father   . Diabetes Father   . Diabetes Sister   . Colon polyps Neg Hx   . Esophageal cancer Neg Hx   . Rectal cancer Neg Hx   . Stomach cancer Neg Hx   . Colon cancer Neg Hx   . Pancreatic cancer Neg Hx   . Liver disease Neg Hx     Social  History   Socioeconomic History  . Marital status: Married    Spouse name: Not on file  . Number of children: Not on file  . Years of education: Not on file  . Highest education level: Not on file  Occupational History  . Not on file  Tobacco Use  . Smoking status: Never Smoker  . Smokeless tobacco: Never Used  Vaping Use  . Vaping Use: Never used  Substance and Sexual Activity  . Alcohol use: No  . Drug use: No  . Sexual activity: Not Currently  Other Topics Concern  . Not on file  Social History Narrative   Lives in [place] with husband.    3 children. Dog in house.   Work: Medical illustrator, Public relations account executive   Diet: regular   Exercise: walks 3-4 times per week   Social Determinants of Radio broadcast assistant Strain: Not on file  Food Insecurity: Not on file  Transportation Needs: Not on file  Physical Activity: Not on file  Stress: Not on file  Social Connections: Not on file  Intimate Partner Violence: Not on file    ROS  General:  Negative for nexplained weight loss, fever Skin: Negative for new or changing mole, sore that won't heal HEENT: Negative for trouble hearing, trouble seeing, ringing in ears, mouth sores, hoarseness, change in voice, dysphagia. CV:  Negative for chest pain,  dyspnea, edema, palpitations Resp: Negative for cough, dyspnea, hemoptysis GI: Negative for nausea, vomiting, diarrhea, constipation, abdominal pain, melena, hematochezia. GU: Negative for dysuria, incontinence, urinary hesitance, hematuria, vaginal or penile discharge, polyuria, sexual difficulty, lumps in testicle or breasts MSK: Negative for muscle cramps or aches, joint pain or swelling Neuro: Negative for headaches, weakness, numbness, dizziness, passing out/fainting Psych: Negative for depression, anxiety, memory problems  Objective  Physical Exam Vitals:   12/02/20 0932  BP: 118/80  Pulse: 70  Temp: 98.2 F (36.8 C)  SpO2: 99%    BP Readings from Last 3  Encounters:  12/02/20 118/80  09/11/20 124/72  09/03/20 112/72   Wt Readings from Last 3 Encounters:  12/02/20 280 lb 3.2 oz (127.1 kg)  09/11/20 279 lb (126.6 kg)  08/28/20 276 lb 6.4 oz (125.4 kg)    Physical Exam Constitutional:      General: She is not in acute distress.    Appearance: She is not diaphoretic.  HENT:     Head: Normocephalic and atraumatic.  Eyes:     Conjunctiva/sclera: Conjunctivae normal.     Pupils: Pupils are equal, round, and reactive to light.  Cardiovascular:     Rate and Rhythm: Normal rate and regular rhythm.     Heart sounds: Normal heart sounds.  Pulmonary:     Effort: Pulmonary effort is normal.     Breath sounds: Normal breath sounds.  Abdominal:     General: Bowel sounds are normal. There is no distension.     Palpations: Abdomen is soft.     Tenderness: There is no abdominal tenderness. There is no guarding or rebound.  Musculoskeletal:     Right lower leg: No edema.     Left lower leg: No edema.  Lymphadenopathy:     Cervical: No cervical adenopathy.  Skin:    General: Skin is warm and dry.  Neurological:     Mental Status: She is alert.  Psychiatric:        Mood and Affect: Mood normal.      Assessment/Plan:   Problem List Items Addressed This Visit    Hypertension - Primary   Relevant Medications   losartan-hydrochlorothiazide (HYZAAR) 50-12.5 MG tablet   Obesity   Relevant Orders   HgB A1c   Routine general medical examination at a health care facility    Physical exam completed.  Encouraged continued exercise.  Discussed healthy diet with less sugar intake.  Pap smear and colonoscopy are up-to-date.  The patient will call to schedule her mammogram.  Breast exam and pelvic exam deferred given that she follows with GYN.  Discussed Shingrix vaccine and she will check with her insurance regarding coverage.  Lab work as outlined.       Other Visit Diagnoses    Essential hypertension       Relevant Medications    losartan-hydrochlorothiazide (HYZAAR) 50-12.5 MG tablet   Other Relevant Orders   Comp Met (CMET)   Lipid panel   Encounter for screening mammogram for malignant neoplasm of breast       Relevant Orders   MM 3D SCREEN BREAST BILATERAL      This visit occurred during the SARS-CoV-2 public health emergency.  Safety protocols were in place, including screening questions prior to the visit, additional usage of staff PPE, and extensive cleaning of exam room while observing appropriate contact time as indicated for disinfecting solutions.    Heidi Rumps, MD [place]

## 2020-12-08 ENCOUNTER — Other Ambulatory Visit: Payer: Self-pay | Admitting: Family Medicine

## 2020-12-08 ENCOUNTER — Telehealth: Payer: Self-pay

## 2020-12-08 DIAGNOSIS — N179 Acute kidney failure, unspecified: Secondary | ICD-10-CM

## 2020-12-08 NOTE — Telephone Encounter (Signed)
-----   Message from Leone Haven, MD sent at 12/08/2020  9:05 AM EDT ----- Let's recheck her kidney function in about a week. Orders placed.

## 2020-12-08 NOTE — Telephone Encounter (Signed)
I called and LVM for the patient to call back to schedule a lab appointment in 1 week to recheck kidney function.  Lincy Belles,cma

## 2020-12-16 ENCOUNTER — Other Ambulatory Visit: Payer: Self-pay

## 2020-12-16 ENCOUNTER — Other Ambulatory Visit (INDEPENDENT_AMBULATORY_CARE_PROVIDER_SITE_OTHER): Payer: 59

## 2020-12-16 DIAGNOSIS — N179 Acute kidney failure, unspecified: Secondary | ICD-10-CM

## 2020-12-17 LAB — BASIC METABOLIC PANEL
BUN: 16 mg/dL (ref 6–23)
CO2: 29 mEq/L (ref 19–32)
Calcium: 9.3 mg/dL (ref 8.4–10.5)
Chloride: 101 mEq/L (ref 96–112)
Creatinine, Ser: 1.13 mg/dL (ref 0.40–1.20)
GFR: 53.54 mL/min — ABNORMAL LOW (ref >60.00)
Glucose, Bld: 94 mg/dL (ref 70–99)
Potassium: 3.9 mEq/L (ref 3.5–5.1)
Sodium: 139 mEq/L (ref 135–145)

## 2020-12-21 ENCOUNTER — Telehealth: Payer: Self-pay | Admitting: Family Medicine

## 2020-12-21 NOTE — Telephone Encounter (Signed)
I called and LVM for the patient to call back and ask for Gae Bon for lab results.  Milton Sagona,cma

## 2020-12-21 NOTE — Telephone Encounter (Signed)
Patient was returning call for results 

## 2020-12-21 NOTE — Telephone Encounter (Signed)
Patient was returning call again for results

## 2020-12-21 NOTE — Telephone Encounter (Signed)
Called and spoke with patient and informed her of her lab results.  Adin Lariccia,cma

## 2021-04-02 ENCOUNTER — Encounter: Payer: Self-pay | Admitting: Family Medicine

## 2021-06-04 ENCOUNTER — Ambulatory Visit: Payer: 59 | Admitting: Family Medicine

## 2021-06-15 ENCOUNTER — Ambulatory Visit: Payer: 59 | Admitting: Family Medicine

## 2021-07-10 ENCOUNTER — Other Ambulatory Visit: Payer: Self-pay

## 2021-07-10 ENCOUNTER — Encounter: Payer: Self-pay | Admitting: Emergency Medicine

## 2021-07-10 ENCOUNTER — Ambulatory Visit
Admission: EM | Admit: 2021-07-10 | Discharge: 2021-07-10 | Disposition: A | Discharge status: Home / Self Care | Payer: 59 | Attending: Emergency Medicine | Admitting: Emergency Medicine

## 2021-07-10 DIAGNOSIS — B349 Viral infection, unspecified: Secondary | ICD-10-CM

## 2021-07-10 MED ORDER — BENZONATATE 100 MG PO CAPS: 100.0000 mg | ORAL_CAPSULE | Freq: Three times a day (TID) | ORAL | 0 refills | Status: DC | PRN

## 2021-07-10 NOTE — ED Triage Notes (Signed)
Pt here with flu-like sx starting on Thursday.

## 2021-07-10 NOTE — Discharge Instructions (Addendum)
Take the [facility] as needed for cough.    Your COVID and Flu tests are pending.    Take Tylenol or ibuprofen as needed for fever or discomfort.  Rest and keep yourself hydrated.    Follow-up with your primary care provider if your symptoms are not improving.

## 2021-07-10 NOTE — ED Provider Notes (Signed)
Heidi Hicks    CSN: [id number] Arrival date & time: 07/10/21  1961      History   Chief Complaint Chief Complaint  Patient presents with   Generalized Body Aches   Headache   Nasal Congestion    HPI Heidi Hicks is a 60 y.o. female.  Patient presents with 3-day history of fever, body aches, headache, congestion, scratchy throat, dry cough.  T-max 101.6.  Treatment at home with Tylenol cold and flu.  She denies rash, shortness of breath, vomiting, diarrhea, or other symptoms.  Her medical history includes hypertension.  The history is provided by the patient and medical records.   Past Medical History:  Diagnosis Date   Arthritis    Hypertension     Patient Active Problem List   Diagnosis Date Noted   Postmenopausal bleeding 08/17/2020   BRBPR (bright red blood per rectum) 08/08/2020   Psoriasis 10/10/2019   Routine general medical examination at a health care facility 10/10/2017   Left ankle pain 05/05/2016   Obesity 10/01/2013   Arthralgia 02/04/2013   Hypertension 12/29/2011    Past Surgical History:  Procedure Laterality Date   abkle fusion  [place]  2002   reduction   broken ankle  [place]  2009   HYSTEROSCOPY WITH D & C N/A 09/03/2020   Procedure: DILATATION AND CURETTAGE /HYSTEROSCOPY, polypectomy;  Surgeon: Homero Fellers, MD;  Location: [facility] ORS;  Service: Gynecology;  Laterality: N/A;   POLYPECTOMY     REDUCTION MAMMAPLASTY Bilateral 2002   anchor reduction scars   TONSILLECTOMY AND ADENOIDECTOMY  1971    OB History     Gravida  3   Para  3   Term  3   Preterm      AB      Living  3      SAB      IAB      Ectopic      Multiple      Live Births               Home Medications    Prior to Admission medications   Medication Sig Start Date End Date Taking? Authorizing Provider  benzonatate (TESSALON) 100 MG  capsule Take 1 capsule (100 mg total) by mouth 3 (three) times daily as needed for cough. 07/10/21  Yes Sharion Balloon, NP  clobetasol cream (TEMOVATE) 0.05 % APPLY TO AFFECTED AREA TWICE A DAY Patient taking differently: Apply 1 application topically as needed (psoriasis). 10/10/19   Leone Haven, MD  ibuprofen (ADVIL) 600 MG tablet Take 1 tablet (600 mg total) by mouth every 8 (eight) hours as needed for moderate pain. 12/02/20   Leone Haven, MD  losartan-hydrochlorothiazide (HYZAAR) 50-12.5 MG tablet TAKE 1 TABLET BY MOUTH DAILY. 12/02/20   Leone Haven, MD  Multiple Vitamins-Minerals (ADULT GUMMY PO) Take 2 capsules by mouth daily.    [provider]  omeprazole (PRILOSEC) 20 MG capsule TAKE 1 CAPSULE BY MOUTH EVERY DAY 12/02/20   Leone Haven, MD  OVER THE COUNTER MEDICATION Take 1 tablet by mouth daily. Blood Builder otc supplement    [provider]  traMADol (ULTRAM) 50 MG tablet Take 1 tablet (50 mg total) by mouth every 6 (six) hours as needed. 09/03/20 09/03/21  Homero Fellers, MD    Family  History Family History  Problem Relation Age of Onset   Hypertension Mother    Diabetes Mother    Renal cancer Mother 98   Hypertension Father    Diabetes Father    Diabetes Sister    Colon polyps Neg Hx    Esophageal cancer Neg Hx    Rectal cancer Neg Hx    Stomach cancer Neg Hx    Colon cancer Neg Hx    Pancreatic cancer Neg Hx    Liver disease Neg Hx     Social History Social History   Tobacco Use   Smoking status: Never   Smokeless tobacco: Never  Vaping Use   Vaping Use: Never used  Substance Use Topics   Alcohol use: No   Drug use: No     Allergies   Ace inhibitors   Review of Systems Review of Systems  Constitutional:  Positive for fever. Negative for chills.  HENT:  Positive for congestion and sore throat. Negative for ear pain.   Respiratory:  Positive for cough. Negative for shortness of breath.   Cardiovascular:   Negative for chest pain and palpitations.  Gastrointestinal:  Negative for diarrhea and vomiting.  Skin:  Negative for color change and rash.  Neurological:  Positive for headaches. Negative for syncope.  All other systems reviewed and are negative.   Physical Exam Triage Vital Signs ED Triage Vitals  Enc Vitals Group     BP      Pulse      Resp      Temp      Temp src      SpO2      Weight      Height      Head Circumference      Peak Flow      Pain Score      Pain Loc      Pain Edu?      Excl. in [place]?    No data found.  Updated Vital Signs BP 140/83   Pulse (!) 104   Temp 99.5 F (37.5 C) (Oral)   Resp 18   LMP 07/23/2011   SpO2 98%   Visual Acuity Right Eye Distance:   Left Eye Distance:   Bilateral Distance:    Right Eye Near:   Left Eye Near:    Bilateral Near:     Physical Exam Vitals and nursing note reviewed.  Constitutional:      General: She is not in acute distress.    Appearance: She is well-developed. She is obese.  HENT:     Head: Normocephalic and atraumatic.     Right Ear: Tympanic membrane normal.     Left Ear: Tympanic membrane normal.     Nose: Nose normal.     Mouth/Throat:     Mouth: Mucous membranes are moist.     Pharynx: Oropharynx is clear.  Eyes:     Conjunctiva/sclera: Conjunctivae normal.  Cardiovascular:     Rate and Rhythm: Normal rate and regular rhythm.     Heart sounds: Normal heart sounds.  Pulmonary:     Effort: Pulmonary effort is normal. No respiratory distress.     Breath sounds: Normal breath sounds.  Abdominal:     Palpations: Abdomen is soft.     Tenderness: There is no abdominal tenderness.  Musculoskeletal:     Cervical back: Neck supple.  Skin:    General: Skin is warm and dry.  Neurological:     Mental Status: She  is alert.  Psychiatric:        Mood and Affect: Mood normal.        Behavior: Behavior normal.     UC Treatments / Results  Labs (all labs ordered are listed, but only abnormal  results are displayed) Labs Reviewed  COVID-19, FLU A+B NAA    EKG   Radiology No results found.  Procedures Procedures (including critical care time)  Medications Ordered in UC Medications - No data to display  Initial Impression / Assessment and Plan / UC Course  I have reviewed the triage vital signs and the nursing notes.  Pertinent labs & imaging results that were available during my care of the patient were reviewed by me and considered in my medical decision making (see chart for details).   Viral illness.  COVID and flu pending.  Treating cough with Tessalon Perles.  Discussed symptomatic treatment including Tylenol or ibuprofen, rest, hydration.  Instructed patient to follow up with PCP if symptoms are not improving.  Patient agrees to plan of care.    Final Clinical Impressions(s) / UC Diagnoses   Final diagnoses:  Viral illness     Discharge Instructions      Take the Tessalon Perles as needed for cough.    Your COVID and Flu tests are pending.    Take Tylenol or ibuprofen as needed for fever or discomfort.  Rest and keep yourself hydrated.    Follow-up with your primary care provider if your symptoms are not improving.         ED Prescriptions     Medication Sig Dispense Auth. Provider   benzonatate (TESSALON) 100 MG capsule Take 1 capsule (100 mg total) by mouth 3 (three) times daily as needed for cough. 21 capsule Sharion Balloon, NP      PDMP not reviewed this encounter.   Sharion Balloon, NP 07/10/21 [phone]

## 2021-07-11 LAB — COVID-19, FLU A+B NAA
Influenza A, NAA: NOT DETECTED
Influenza B, NAA: NOT DETECTED
SARS-CoV-2, NAA: NOT DETECTED

## 2021-07-26 ENCOUNTER — Ambulatory Visit: Payer: 59 | Admitting: Family Medicine

## 2021-07-26 ENCOUNTER — Encounter: Payer: Self-pay | Admitting: Family Medicine

## 2021-07-26 ENCOUNTER — Other Ambulatory Visit: Payer: Self-pay

## 2021-07-26 VITALS — BP 110/70 | HR 83 | Temp 98.3°F | Ht 68.0 in | Wt 279.0 lb

## 2021-07-26 DIAGNOSIS — G8929 Other chronic pain: Secondary | ICD-10-CM

## 2021-07-26 DIAGNOSIS — R7303 Prediabetes: Secondary | ICD-10-CM

## 2021-07-26 DIAGNOSIS — Z23 Encounter for immunization: Secondary | ICD-10-CM | POA: Diagnosis not present

## 2021-07-26 DIAGNOSIS — I10 Essential (primary) hypertension: Secondary | ICD-10-CM | POA: Diagnosis not present

## 2021-07-26 DIAGNOSIS — Z6841 Body Mass Index (BMI) 40.0 and over, adult: Secondary | ICD-10-CM

## 2021-07-26 DIAGNOSIS — M25572 Pain in left ankle and joints of left foot: Secondary | ICD-10-CM | POA: Diagnosis not present

## 2021-07-26 LAB — BASIC METABOLIC PANEL
BUN: 14 mg/dL (ref 6–23)
CO2: 28 mEq/L (ref 19–32)
Calcium: 9.4 mg/dL (ref 8.4–10.5)
Chloride: 102 mEq/L (ref 96–112)
Creatinine, Ser: 1.04 mg/dL (ref 0.40–1.20)
GFR: 58.89 mL/min — ABNORMAL LOW (ref >60.00)
Glucose, Bld: 84 mg/dL (ref 70–99)
Potassium: 3.9 mEq/L (ref 3.5–5.1)
Sodium: 138 mEq/L (ref 135–145)

## 2021-07-26 LAB — HEMOGLOBIN A1C: Hgb A1c MFr Bld: 6.3 % (ref 4.6–6.5)

## 2021-07-26 NOTE — Assessment & Plan Note (Signed)
Check A1c.  Encouraged diet and exercise.

## 2021-07-26 NOTE — Assessment & Plan Note (Signed)
Chronic issue.  Rechecking her kidney function today.  Discussed that we may be able to try meloxicam at a low dose to see if that would provide any additional benefit.  Discussed we would have to monitor her kidney function closely if we went back to using NSAIDs.

## 2021-07-26 NOTE — Assessment & Plan Note (Signed)
Well-controlled.  She will continue losartan/HCTZ 1 tablet daily.  We will check a BMP.

## 2021-07-26 NOTE — Assessment & Plan Note (Signed)
>>  ASSESSMENT AND PLAN FOR OBESITY WRITTEN ON 07/26/2021 12:26 PM BY SONNENBERG, ERIC G, MD  Encouraged increasing her activity level as she is able to.  Discussed reducing her sugar intake.

## 2021-07-26 NOTE — Progress Notes (Signed)
[name], MD Phone: [phone]  Heidi Hicks is a 60 y.o. female who presents today for f/u.  HYPERTENSION Disease Monitoring Home BP Monitoring not checking Chest pain- no    Dyspnea- no Medications Compliance-  taking losartan/HCTZ.   Edema- chronic in her left ankle related to prior surgery BMET    Component Value Date/Time   NA 139 12/16/2020 1415   K 3.9 12/16/2020 1415   CL 101 12/16/2020 1415   CO2 29 12/16/2020 1415   GLUCOSE 94 12/16/2020 1415   BUN 16 12/16/2020 1415   CREATININE 1.13 12/16/2020 1415   CALCIUM 9.3 12/16/2020 1415   GFRNONAA >60 08/07/2020 2136   Obesity: Patient notes she has been trying to do the elliptical some.  She does not like it nearly as much as she likes walking outside.  She notes her sugar intake could be better.  She is a vegetarian.  She typically has a vegetarian patty as a sandwich with a banana for breakfast.  She has leftovers for lunch around 2 PM and has some sweets after lunch.  Her dinner is much lighter.  Prediabetes: No polyuria or polydipsia.  Chronic left ankle pain: Patient had surgery on this in the past and has a plate and screws in place.  She was taken off of ibuprofen in the past given her kidney function.  She notes Tylenol is not helpful.  She states orthopedics advised her that she might need another surgery though it would be a tedious surgery and she wants to try to avoid any surgery.  Social History   Tobacco Use  Smoking Status Never  Smokeless Tobacco Never    Current Outpatient Medications on File Prior to Visit  Medication Sig Dispense Refill   benzonatate (TESSALON) 100 MG capsule Take 1 capsule (100 mg total) by mouth 3 (three) times daily as needed for cough. 21 capsule 0   clobetasol cream (TEMOVATE) 0.05 % APPLY TO AFFECTED AREA TWICE A DAY (Patient taking differently: Apply 1 application topically as needed (psoriasis).) 15 g 1   ibuprofen (ADVIL) 600 MG tablet Take 1 tablet (600 mg total)  by mouth every 8 (eight) hours as needed for moderate pain. 30 tablet 0   losartan-hydrochlorothiazide (HYZAAR) 50-12.5 MG tablet TAKE 1 TABLET BY MOUTH DAILY. 90 tablet 3   Multiple Vitamins-Minerals (ADULT GUMMY PO) Take 2 capsules by mouth daily.     omeprazole (PRILOSEC) 20 MG capsule TAKE 1 CAPSULE BY MOUTH EVERY DAY 90 capsule 3   OVER THE COUNTER MEDICATION Take 1 tablet by mouth daily. Blood Builder otc supplement     traMADol (ULTRAM) 50 MG tablet Take 1 tablet (50 mg total) by mouth every 6 (six) hours as needed. 20 tablet 0   No current facility-administered medications on file prior to visit.     ROS see history of present illness  Objective  Physical Exam Vitals:   07/26/21 1145  BP: 110/70  Pulse: 83  Temp: 98.3 F (36.8 C)  SpO2: 99%    BP Readings from Last 3 Encounters:  07/26/21 110/70  07/10/21 140/83  12/02/20 118/80   Wt Readings from Last 3 Encounters:  07/26/21 279 lb (126.6 kg)  12/02/20 280 lb 3.2 oz (127.1 kg)  09/11/20 279 lb (126.6 kg)    Physical Exam Constitutional:      General: She is not in acute distress.    Appearance: She is not diaphoretic.  Cardiovascular:     Rate and Rhythm: Normal rate and  regular rhythm.     Heart sounds: Normal heart sounds.  Pulmonary:     Effort: Pulmonary effort is normal.     Breath sounds: Normal breath sounds.  Skin:    General: Skin is warm and dry.  Neurological:     Mental Status: She is alert.     Assessment/Plan: Please see individual problem list.  Problem List Items Addressed This Visit     Hypertension - Primary    Well-controlled.  She will continue losartan/HCTZ 1 tablet daily.  We will check a BMP.      Relevant Orders   Basic Metabolic Panel (BMET)   Left ankle pain    Chronic issue.  Rechecking her kidney function today.  Discussed that we may be able to try meloxicam at a low dose to see if that would provide any additional benefit.  Discussed we would have to monitor her  kidney function closely if we went back to using NSAIDs.      Obesity    Encouraged increasing her activity level as she is able to.  Discussed reducing her sugar intake.      Prediabetes    Check A1c.  Encouraged diet and exercise.      Relevant Orders   HgB A1c   Other Visit Diagnoses     Need for immunization against influenza       Relevant Orders   Flu Vaccine QUAD 57mo+IM (Fluarix, Fluzone & Alfiuria Quad PF) (Completed)      Return in about 6 months (around 01/24/2022) for Hypertension.  This visit occurred during the SARS-CoV-2 public health emergency.  Safety protocols were in place, including screening questions prior to the visit, additional usage of staff PPE, and extensive cleaning of exam room while observing appropriate contact time as indicated for disinfecting solutions.    [name], MD [place]

## 2021-07-26 NOTE — Assessment & Plan Note (Signed)
Encouraged increasing her activity level as she is able to.  Discussed reducing her sugar intake.

## 2021-07-26 NOTE — Patient Instructions (Signed)
Nice to see you. We will get some lab work today and contact you with the results.  Depending on what your kidney function is we may be able to restart meloxicam. Please try to reduce your sugary food intake and increase your activity level.

## 2021-07-30 ENCOUNTER — Other Ambulatory Visit: Payer: Self-pay | Admitting: Family Medicine

## 2021-07-30 DIAGNOSIS — Z5181 Encounter for therapeutic drug level monitoring: Secondary | ICD-10-CM

## 2021-07-30 MED ORDER — MELOXICAM 7.5 MG PO TABS: 7.5000 mg | ORAL_TABLET | Freq: Every day | ORAL | 0 refills | Status: DC

## 2021-08-20 ENCOUNTER — Other Ambulatory Visit: Payer: Self-pay

## 2021-08-20 ENCOUNTER — Other Ambulatory Visit (INDEPENDENT_AMBULATORY_CARE_PROVIDER_SITE_OTHER): Payer: 59

## 2021-08-20 DIAGNOSIS — Z5181 Encounter for therapeutic drug level monitoring: Secondary | ICD-10-CM | POA: Diagnosis not present

## 2021-08-20 LAB — BASIC METABOLIC PANEL
BUN: 16 mg/dL (ref 6–23)
CO2: 31 mEq/L (ref 19–32)
Calcium: 9.1 mg/dL (ref 8.4–10.5)
Chloride: 100 mEq/L (ref 96–112)
Creatinine, Ser: 1.03 mg/dL (ref 0.40–1.20)
GFR: 59.55 mL/min — ABNORMAL LOW (ref >60.00)
Glucose, Bld: 84 mg/dL (ref 70–99)
Potassium: 3.9 mEq/L (ref 3.5–5.1)
Sodium: 139 mEq/L (ref 135–145)

## 2021-08-26 ENCOUNTER — Other Ambulatory Visit: Payer: Self-pay | Admitting: Family Medicine

## 2021-08-27 NOTE — Telephone Encounter (Signed)
Meloxicam last fill 07/30/21 last OV 07/26/21 Okay to fill?

## 2021-10-03 ENCOUNTER — Other Ambulatory Visit: Payer: Self-pay | Admitting: Family

## 2021-10-04 ENCOUNTER — Other Ambulatory Visit: Payer: Self-pay | Admitting: Family Medicine

## 2021-10-05 NOTE — Telephone Encounter (Signed)
Please call the patient and see if the meloxicam has been beneficial.  Please find out how often she has been taking it.  If it has not can be refilled for 30 tablets with 0 refills.

## 2021-10-06 ENCOUNTER — Other Ambulatory Visit: Payer: Self-pay | Admitting: Family Medicine

## 2021-10-06 NOTE — Telephone Encounter (Signed)
Per the patient the Meloxicam has been beneficial it has helped her with the pain . She does take the medication every other day.

## 2021-10-07 ENCOUNTER — Other Ambulatory Visit: Payer: Self-pay

## 2021-10-07 MED ORDER — MELOXICAM 7.5 MG PO TABS: ORAL_TABLET | ORAL | 0 refills | Status: DC

## 2021-10-07 NOTE — Telephone Encounter (Signed)
Patient is requesting a refill on Mobic Last OV:   07/26/2021 Future OV:   none Loyola Santino,cma

## 2021-11-05 ENCOUNTER — Telehealth: Payer: Self-pay | Admitting: Obstetrics and Gynecology

## 2021-11-05 NOTE — Telephone Encounter (Signed)
Pt is wanting to schedule an annual with you.  You don't have any annual spots available.  Is it ok if I use one of the New GYN/ER FU spots, as long it works with your schedule? ?

## 2021-11-08 NOTE — Telephone Encounter (Signed)
okay

## 2021-11-19 ENCOUNTER — Ambulatory Visit: Payer: 59 | Admitting: Obstetrics and Gynecology

## 2021-11-29 ENCOUNTER — Telehealth: Payer: Self-pay | Admitting: Family Medicine

## 2021-11-29 DIAGNOSIS — Z1231 Encounter for screening mammogram for malignant neoplasm of breast: Secondary | ICD-10-CM

## 2021-11-29 NOTE — Telephone Encounter (Signed)
I received a message from the EMR that the patient is overdue for her mammogram.  Can you contact her and advise her to call [phone] to get this scheduled?  Thanks. ?

## 2021-11-29 NOTE — Telephone Encounter (Signed)
I called and LVM for the patient giving her the number to call and schedule her mammogram.  Heidi Hicks,cma  ?

## 2022-01-18 ENCOUNTER — Other Ambulatory Visit: Payer: Self-pay | Admitting: Family Medicine

## 2022-01-18 ENCOUNTER — Ambulatory Visit
Admission: RE | Admit: 2022-01-18 | Discharge: 2022-01-18 | Disposition: A | Discharge status: Home / Self Care | Payer: 59 | Source: Ambulatory Visit | Attending: Family Medicine | Admitting: Family Medicine

## 2022-01-18 DIAGNOSIS — Z1231 Encounter for screening mammogram for malignant neoplasm of breast: Secondary | ICD-10-CM | POA: Diagnosis present

## 2022-01-19 ENCOUNTER — Other Ambulatory Visit: Payer: Self-pay | Admitting: Family Medicine

## 2022-01-19 DIAGNOSIS — I10 Essential (primary) hypertension: Secondary | ICD-10-CM

## 2022-01-19 NOTE — Telephone Encounter (Signed)
Sent to pharmacy. Patient needs to be scheduled for follow-up for her BP in the next couple of months. Please contact her for this.

## 2022-04-19 ENCOUNTER — Other Ambulatory Visit: Payer: Self-pay | Admitting: Family Medicine

## 2022-04-19 DIAGNOSIS — I10 Essential (primary) hypertension: Secondary | ICD-10-CM

## 2022-05-26 ENCOUNTER — Other Ambulatory Visit: Payer: Self-pay | Admitting: Family Medicine

## 2022-06-23 ENCOUNTER — Other Ambulatory Visit: Payer: Self-pay | Admitting: Family Medicine

## 2022-06-23 DIAGNOSIS — I10 Essential (primary) hypertension: Secondary | ICD-10-CM

## 2022-07-19 ENCOUNTER — Ambulatory Visit: Payer: 59 | Admitting: Family Medicine

## 2022-07-19 ENCOUNTER — Encounter: Payer: Self-pay | Admitting: Family Medicine

## 2022-07-19 VITALS — BP 120/74 | HR 80 | Temp 98.1°F | Ht 68.0 in | Wt 286.6 lb

## 2022-07-19 DIAGNOSIS — I10 Essential (primary) hypertension: Secondary | ICD-10-CM

## 2022-07-19 DIAGNOSIS — Z23 Encounter for immunization: Secondary | ICD-10-CM | POA: Diagnosis not present

## 2022-07-19 DIAGNOSIS — G8929 Other chronic pain: Secondary | ICD-10-CM | POA: Diagnosis not present

## 2022-07-19 DIAGNOSIS — M25572 Pain in left ankle and joints of left foot: Secondary | ICD-10-CM | POA: Diagnosis not present

## 2022-07-19 LAB — LIPID PANEL
Cholesterol: 173 mg/dL (ref 0–200)
HDL: 47.3 mg/dL (ref >39.00)
LDL Cholesterol: 100 mg/dL — ABNORMAL HIGH (ref 0–99)
NonHDL: 125.25
Total CHOL/HDL Ratio: 4
Triglycerides: 128 mg/dL (ref 0.0–149.0)
VLDL: 25.6 mg/dL (ref 0.0–40.0)

## 2022-07-19 LAB — COMPREHENSIVE METABOLIC PANEL
ALT: 13 U/L (ref 0–35)
AST: 15 U/L (ref 0–37)
Albumin: 4.1 g/dL (ref 3.5–5.2)
Alkaline Phosphatase: 102 U/L (ref 39–117)
BUN: 11 mg/dL (ref 6–23)
CO2: 30 mEq/L (ref 19–32)
Calcium: 8.8 mg/dL (ref 8.4–10.5)
Chloride: 101 mEq/L (ref 96–112)
Creatinine, Ser: 1.04 mg/dL (ref 0.40–1.20)
GFR: 58.49 mL/min — ABNORMAL LOW (ref >60.00)
Glucose, Bld: 97 mg/dL (ref 70–99)
Potassium: 3.9 mEq/L (ref 3.5–5.1)
Sodium: 139 mEq/L (ref 135–145)
Total Bilirubin: 0.6 mg/dL (ref 0.2–1.2)
Total Protein: 7.1 g/dL (ref 6.0–8.3)

## 2022-07-19 LAB — HEMOGLOBIN A1C: Hgb A1c MFr Bld: 6.4 % (ref 4.6–6.5)

## 2022-07-19 MED ORDER — MELOXICAM 7.5 MG PO TABS: ORAL_TABLET | ORAL | 0 refills | Status: DC

## 2022-07-19 NOTE — Assessment & Plan Note (Signed)
Well-controlled.  She will continue losartan-HCTZ 50-12.5 mg once daily.

## 2022-07-19 NOTE — Progress Notes (Signed)
[name], MD Phone: [phone]  Heidi Hicks is a 61 y.o. female who presents today for f/u.  HYPERTENSION Disease Monitoring Home BP Monitoring similar to today when she checks Chest pain- no    Dyspnea- no Medications Compliance-  taking losartan/HCTZ.   Edema- no change to chronic edema l BMET    Component Value Date/Time   NA 139 08/20/2021 1406   K 3.9 08/20/2021 1406   CL 100 08/20/2021 1406   CO2 31 08/20/2021 1406   GLUCOSE 84 08/20/2021 1406   BUN 16 08/20/2021 1406   CREATININE 1.03 08/20/2021 1406   CALCIUM 9.1 08/20/2021 1406   GFRNONAA >60 08/07/2020 2136   Chronic left ankle pain: Patient notes continued issues with her left ankle.  She notes it was fused in the past though there is been more movement recently than they used to be.  Meloxicam does help but she only takes it once a week.  Social History   Tobacco Use  Smoking Status Never  Smokeless Tobacco Never    Current Outpatient Medications on File Prior to Visit  Medication Sig Dispense Refill   losartan-hydrochlorothiazide (HYZAAR) 50-12.5 MG tablet TAKE 1 TABLET BY MOUTH DAILY 30 tablet 1   No current facility-administered medications on file prior to visit.     ROS see history of present illness  Objective  Physical Exam Vitals:   07/19/22 0902  BP: 120/74  Pulse: 80  Temp: 98.1 F (36.7 C)  SpO2: 99%    BP Readings from Last 3 Encounters:  07/19/22 120/74  07/26/21 110/70  07/10/21 140/83   Wt Readings from Last 3 Encounters:  07/19/22 286 lb 9.6 oz (130 kg)  07/26/21 279 lb (126.6 kg)  12/02/20 280 lb 3.2 oz (127.1 kg)    Physical Exam Constitutional:      General: She is not in acute distress.    Appearance: She is not diaphoretic.  Cardiovascular:     Rate and Rhythm: Normal rate and regular rhythm.     Heart sounds: Normal heart sounds.  Pulmonary:     Effort: Pulmonary effort is normal.     Breath sounds: Normal breath sounds.  Musculoskeletal:      Comments: Left ankle with swelling and reduced flexion and extension  Skin:    General: Skin is warm and dry.  Neurological:     Mental Status: She is alert.      Assessment/Plan: Please see individual problem list.  Problem List Items Addressed This Visit     Hypertension - Primary (Chronic)    Well-controlled.  She will continue losartan-HCTZ 50-12.5 mg once daily.      Relevant Orders   Lipid panel   Comp Met (CMET)   HgB A1c   Left ankle pain (Chronic)    Chronic issue.  Patient is not yet ready to go back and see orthopedics for this.  Discussed she could try taking the meloxicam every other day as needed for pain.  We will check her kidney function today and in 3 weeks.  Advised to take this with food.      Relevant Medications   meloxicam (MOBIC) 7.5 MG tablet   Other Relevant Orders   Basic Metabolic Panel (BMET)   Other Visit Diagnoses     Need for immunization against influenza       Relevant Orders   Flu Vaccine QUAD 29moIM (Fluarix, Fluzone & Alfiuria Quad PF) (Completed)        Return in about 3  weeks (around 08/09/2022) for labs, 6 months PCP.   [name], MD [place]

## 2022-07-19 NOTE — Patient Instructions (Signed)
Nice to see you. You can try taking the meloxicam every other day as needed for your pain.  You need to take this with food.  If it starts to irritate your stomach you should stop it and let us know.

## 2022-07-19 NOTE — Assessment & Plan Note (Signed)
Chronic issue.  Patient is not yet ready to go back and see orthopedics for this.  Discussed she could try taking the meloxicam every other day as needed for pain.  We will check her kidney function today and in 3 weeks.  Advised to take this with food.

## 2022-07-24 ENCOUNTER — Other Ambulatory Visit: Payer: Self-pay | Admitting: Family Medicine

## 2022-07-24 DIAGNOSIS — I10 Essential (primary) hypertension: Secondary | ICD-10-CM

## 2022-08-10 ENCOUNTER — Ambulatory Visit: Payer: 59

## 2022-08-12 ENCOUNTER — Ambulatory Visit
Admission: RE | Admit: 2022-08-12 | Discharge: 2022-08-12 | Disposition: A | Discharge status: Home / Self Care | Payer: 59 | Source: Ambulatory Visit | Attending: Urgent Care | Admitting: Urgent Care

## 2022-08-12 VITALS — BP 128/85 | HR 81 | Temp 97.5°F | Resp 17

## 2022-08-12 DIAGNOSIS — J209 Acute bronchitis, unspecified: Secondary | ICD-10-CM

## 2022-08-12 DIAGNOSIS — J22 Unspecified acute lower respiratory infection: Secondary | ICD-10-CM | POA: Diagnosis not present

## 2022-08-12 MED ORDER — BENZONATATE 100 MG PO CAPS: ORAL_CAPSULE | ORAL | 0 refills | Status: DC

## 2022-08-12 MED ORDER — PREDNISONE 20 MG PO TABS: ORAL_TABLET | ORAL | 0 refills | Status: AC

## 2022-08-12 MED ORDER — AZITHROMYCIN 250 MG PO TABS: ORAL_TABLET | ORAL | 0 refills | Status: DC

## 2022-08-12 NOTE — ED Provider Notes (Signed)
Heidi Hicks    CSN: [id number] Arrival date & time: 08/12/22  1549      History   Chief Complaint Chief Complaint  Patient presents with   Cough   chest congestion    HPI Heidi Hicks is a 61 y.o. female.    Cough   Presents to urgent care with complaint of productive cough and chest congestion x 2 weeks.  Denies fever, chills, body aches.  She states she was sick with a URI about 2 weeks ago and all symptoms went away except for the cough.  Past Medical History:  Diagnosis Date   Arthritis    Hypertension     Patient Active Problem List   Diagnosis Date Noted   Prediabetes 07/26/2021   Postmenopausal bleeding 08/17/2020   BRBPR (bright red blood per rectum) 08/08/2020   Psoriasis 10/10/2019   Routine general medical examination at a health care facility 10/10/2017   Left ankle pain 05/05/2016   Obesity 10/01/2013   Arthralgia 02/04/2013   Hypertension 12/29/2011    Past Surgical History:  Procedure Laterality Date   abkle fusion  [place]  2002   reduction   broken ankle  [place]  2009   HYSTEROSCOPY WITH D & C N/A 09/03/2020   Procedure: DILATATION AND CURETTAGE /HYSTEROSCOPY, polypectomy;  Surgeon: Homero Fellers, MD;  Location: [facility] ORS;  Service: Gynecology;  Laterality: N/A;   POLYPECTOMY     REDUCTION MAMMAPLASTY Bilateral 2002   anchor reduction scars   TONSILLECTOMY AND ADENOIDECTOMY  1971    OB History     Gravida  3   Para  3   Term  3   Preterm      AB      Living  3      SAB      IAB      Ectopic      Multiple      Live Births               Home Medications    Prior to Admission medications   Medication Sig Start Date End Date Taking? Authorizing Provider  losartan-hydrochlorothiazide (HYZAAR) 50-12.5 MG tablet TAKE 1 TABLET BY MOUTH DAILY 07/24/22   Leone Haven, MD  meloxicam (MOBIC) 7.5 MG  tablet TAKE 1 TABLET(7.5 MG) BY MOUTH every other day as needed for pain 07/19/22   Leone Haven, MD    Family History Family History  Problem Relation Age of Onset   Hypertension Mother    Diabetes Mother    Renal cancer Mother 52   Hypertension Father    Diabetes Father    Diabetes Sister    Colon polyps Neg Hx    Esophageal cancer Neg Hx    Rectal cancer Neg Hx    Stomach cancer Neg Hx    Colon cancer Neg Hx    Pancreatic cancer Neg Hx    Liver disease Neg Hx     Social History Social History   Tobacco Use   Smoking status: Never   Smokeless tobacco: Never  Vaping Use   Vaping Use: Never used  Substance Use Topics   Alcohol use: No   Drug use: No     Allergies   Ace inhibitors   Review of Systems Review of Systems  Respiratory:  Positive for cough.  Physical Exam Triage Vital Signs ED Triage Vitals  Enc Vitals Group     BP 08/12/22 1607 128/85     Pulse Rate 08/12/22 1607 81     Resp 08/12/22 1607 17     Temp 08/12/22 1607 (!) 97.5 F (36.4 C)     Temp src --      SpO2 08/12/22 1607 97 %     Weight --      Height --      Head Circumference --      Peak Flow --      Pain Score 08/12/22 1608 0     Pain Loc --      Pain Edu? --      Excl. in [place]? --    No data found.  Updated Vital Signs BP 128/85   Pulse 81   Temp (!) 97.5 F (36.4 C)   Resp 17   LMP 07/23/2011   SpO2 97%   Visual Acuity Right Eye Distance:   Left Eye Distance:   Bilateral Distance:    Right Eye Near:   Left Eye Near:    Bilateral Near:     Physical Exam Vitals reviewed.  Constitutional:      Appearance: Normal appearance.  Cardiovascular:     Rate and Rhythm: Normal rate and regular rhythm.     Pulses: Normal pulses.     Heart sounds: Normal heart sounds.  Pulmonary:     Effort: Pulmonary effort is normal.     Breath sounds: Normal breath sounds.  Skin:    General: Skin is warm and dry.  Neurological:     General: No focal deficit present.      Mental Status: She is alert and oriented to person, place, and time.  Psychiatric:        Mood and Affect: Mood normal.        Behavior: Behavior normal.      UC Treatments / Results  Labs (all labs ordered are listed, but only abnormal results are displayed) Labs Reviewed - No data to display  EKG   Radiology No results found.  Procedures Procedures (including critical care time)  Medications Ordered in UC Medications - No data to display  Initial Impression / Assessment and Plan / UC Course  I have reviewed the triage vital signs and the nursing notes.  Pertinent labs & imaging results that were available during my care of the patient were reviewed by me and considered in my medical decision making (see chart for details).   Patient is afebrile here without recent antipyretics. Satting well on room air. Overall is well appearing, well hydrated, without respiratory distress. Pulmonary exam is unremarkable.  Lungs CTAB without wheezing, rhonchi, rales.  Suspect post viral syndrome however given cough now greater than 2 weeks, concern for possibility of secondary bacterial infection.  Will treat with azithromycin for covering the bacterial infection while giving a prednisone taper to reduce the presumed bronchitis.  Benzonatate is given for cough.  Final Clinical Impressions(s) / UC Diagnoses   Final diagnoses:  None   Discharge Instructions   None    ED Prescriptions   None    PDMP not reviewed this encounter.   Rose Phi, [place] 08/12/22 1623

## 2022-08-12 NOTE — Discharge Instructions (Signed)
Follow up here or with your primary care provider if your symptoms are worsening or not improving with treatment.     

## 2022-08-12 NOTE — ED Triage Notes (Signed)
Pt. Presents to UC w/ c/o a productive cough and chest congestion for the past 2 weeks

## 2022-08-24 ENCOUNTER — Telehealth: Payer: Self-pay | Admitting: Family Medicine

## 2022-08-24 ENCOUNTER — Other Ambulatory Visit: Payer: Self-pay | Admitting: Family Medicine

## 2022-08-24 DIAGNOSIS — I10 Essential (primary) hypertension: Secondary | ICD-10-CM

## 2022-08-24 NOTE — Telephone Encounter (Signed)
Patient called, she has been having issues with [facility] not filling her medications for 90 days. Last months refill they only gave her 30 day supply not 90 day supply. Patient needs a new prescription, 90 days.

## 2022-08-24 NOTE — Telephone Encounter (Signed)
I called the patient and informed her that I did send in a 90 day supply of the Hyzaar for her and she understood.  Heidi Hicks,cma

## 2022-08-31 ENCOUNTER — Other Ambulatory Visit (INDEPENDENT_AMBULATORY_CARE_PROVIDER_SITE_OTHER): Payer: BC Managed Care – PPO

## 2022-08-31 DIAGNOSIS — M25572 Pain in left ankle and joints of left foot: Secondary | ICD-10-CM

## 2022-08-31 DIAGNOSIS — G8929 Other chronic pain: Secondary | ICD-10-CM | POA: Diagnosis not present

## 2022-08-31 LAB — BASIC METABOLIC PANEL
BUN: 15 mg/dL (ref 6–23)
CO2: 27 mEq/L (ref 19–32)
Calcium: 9 mg/dL (ref 8.4–10.5)
Chloride: 103 mEq/L (ref 96–112)
Creatinine, Ser: 1 mg/dL (ref 0.40–1.20)
GFR: 61.25 mL/min (ref >60.00)
Glucose, Bld: 96 mg/dL (ref 70–99)
Potassium: 4 mEq/L (ref 3.5–5.1)
Sodium: 141 mEq/L (ref 135–145)

## 2022-09-02 ENCOUNTER — Other Ambulatory Visit: Payer: Self-pay | Admitting: Family Medicine

## 2022-09-02 DIAGNOSIS — G8929 Other chronic pain: Secondary | ICD-10-CM

## 2022-09-02 NOTE — Telephone Encounter (Signed)
Refilled: 07/19/2022 Last OV: 07/19/2022 Next OV: 01/18/2023

## 2022-11-02 ENCOUNTER — Other Ambulatory Visit: Payer: Self-pay | Admitting: Family Medicine

## 2022-11-02 DIAGNOSIS — G8929 Other chronic pain: Secondary | ICD-10-CM

## 2022-11-02 NOTE — Telephone Encounter (Signed)
Pt has appt on 01/18/23. Last appt was in November. Okay to refill?   Prescription note says follow-up for further refills.

## 2022-12-26 ENCOUNTER — Telehealth: Payer: Self-pay | Admitting: Family Medicine

## 2022-12-26 DIAGNOSIS — G8929 Other chronic pain: Secondary | ICD-10-CM

## 2022-12-26 MED ORDER — MELOXICAM 7.5 MG PO TABS: ORAL_TABLET | ORAL | 2 refills | Status: DC

## 2022-12-26 NOTE — Telephone Encounter (Signed)
Prescription Request  12/26/2022  LOV: 07/19/2022  What is the name of the medication or equipment? meloxicam (MOBIC) 7.5 MG tablet  Have you contacted your pharmacy to request a refill? Yes   Which pharmacy would you like this sent to?  [facility] [address], [place] - [address] [place] [address] Phone: [phone] Fax: [phone]    Patient notified that their request is being sent to the clinical staff for review and that they should receive a response within 2 business days.   Please advise at [facility] [phone]

## 2022-12-26 NOTE — Telephone Encounter (Signed)
Sent to pharmacy 

## 2023-01-18 ENCOUNTER — Ambulatory Visit: Payer: 59 | Admitting: Family Medicine

## 2023-01-27 ENCOUNTER — Ambulatory Visit: Payer: 59 | Admitting: Family Medicine

## 2023-02-16 ENCOUNTER — Emergency Department: Payer: BC Managed Care – PPO

## 2023-02-16 ENCOUNTER — Other Ambulatory Visit: Payer: Self-pay | Admitting: Family Medicine

## 2023-02-16 ENCOUNTER — Emergency Department
Admission: EM | Admit: 2023-02-16 | Discharge: 2023-02-17 | Disposition: A | Discharge status: Home / Self Care | Payer: BC Managed Care – PPO | Source: Home / Self Care | Attending: Emergency Medicine | Admitting: Emergency Medicine

## 2023-02-16 DIAGNOSIS — M1990 Unspecified osteoarthritis, unspecified site: Secondary | ICD-10-CM | POA: Diagnosis not present

## 2023-02-16 DIAGNOSIS — Z20822 Contact with and (suspected) exposure to covid-19: Secondary | ICD-10-CM | POA: Insufficient documentation

## 2023-02-16 DIAGNOSIS — I10 Essential (primary) hypertension: Secondary | ICD-10-CM

## 2023-02-16 DIAGNOSIS — R5383 Other fatigue: Secondary | ICD-10-CM | POA: Diagnosis not present

## 2023-02-16 DIAGNOSIS — Z79899 Other long term (current) drug therapy: Secondary | ICD-10-CM | POA: Diagnosis not present

## 2023-02-16 DIAGNOSIS — N281 Cyst of kidney, acquired: Secondary | ICD-10-CM | POA: Diagnosis not present

## 2023-02-16 DIAGNOSIS — Z888 Allergy status to other drugs, medicaments and biological substances status: Secondary | ICD-10-CM | POA: Diagnosis not present

## 2023-02-16 DIAGNOSIS — R000 Tachycardia, unspecified: Secondary | ICD-10-CM | POA: Diagnosis not present

## 2023-02-16 DIAGNOSIS — R1031 Right lower quadrant pain: Secondary | ICD-10-CM | POA: Diagnosis not present

## 2023-02-16 DIAGNOSIS — N3090 Cystitis, unspecified without hematuria: Secondary | ICD-10-CM | POA: Insufficient documentation

## 2023-02-16 DIAGNOSIS — Z0389 Encounter for observation for other suspected diseases and conditions ruled out: Secondary | ICD-10-CM | POA: Diagnosis not present

## 2023-02-16 DIAGNOSIS — E876 Hypokalemia: Secondary | ICD-10-CM | POA: Diagnosis not present

## 2023-02-16 DIAGNOSIS — B9620 Unspecified Escherichia coli [E. coli] as the cause of diseases classified elsewhere: Secondary | ICD-10-CM | POA: Diagnosis not present

## 2023-02-16 DIAGNOSIS — R7881 Bacteremia: Secondary | ICD-10-CM | POA: Diagnosis not present

## 2023-02-16 DIAGNOSIS — N3000 Acute cystitis without hematuria: Secondary | ICD-10-CM | POA: Diagnosis not present

## 2023-02-16 DIAGNOSIS — I452 Bifascicular block: Secondary | ICD-10-CM | POA: Diagnosis not present

## 2023-02-16 DIAGNOSIS — Z8249 Family history of ischemic heart disease and other diseases of the circulatory system: Secondary | ICD-10-CM | POA: Diagnosis not present

## 2023-02-16 DIAGNOSIS — K429 Umbilical hernia without obstruction or gangrene: Secondary | ICD-10-CM | POA: Diagnosis not present

## 2023-02-16 DIAGNOSIS — Z981 Arthrodesis status: Secondary | ICD-10-CM | POA: Diagnosis not present

## 2023-02-16 DIAGNOSIS — Z6841 Body Mass Index (BMI) 40.0 and over, adult: Secondary | ICD-10-CM | POA: Diagnosis not present

## 2023-02-16 DIAGNOSIS — Z1152 Encounter for screening for COVID-19: Secondary | ICD-10-CM | POA: Diagnosis not present

## 2023-02-16 DIAGNOSIS — A4151 Sepsis due to Escherichia coli [E. coli]: Secondary | ICD-10-CM | POA: Diagnosis not present

## 2023-02-16 LAB — COMPREHENSIVE METABOLIC PANEL
ALT: 20 U/L (ref 0–44)
AST: 31 U/L (ref 15–41)
Albumin: 4.1 g/dL (ref 3.5–5.0)
Alkaline Phosphatase: 99 U/L (ref 38–126)
Anion gap: 13 (ref 5–15)
BUN: 16 mg/dL (ref 6–20)
CO2: 19 mmol/L — ABNORMAL LOW (ref 22–32)
Calcium: 8.7 mg/dL — ABNORMAL LOW (ref 8.9–10.3)
Chloride: 104 mmol/L (ref 98–111)
Creatinine, Ser: 1.15 mg/dL — ABNORMAL HIGH (ref 0.44–1.00)
GFR, Estimated: 55 mL/min — ABNORMAL LOW (ref >60)
Glucose, Bld: 102 mg/dL — ABNORMAL HIGH (ref 70–99)
Potassium: 3 mmol/L — ABNORMAL LOW (ref 3.5–5.1)
Sodium: 136 mmol/L (ref 135–145)
Total Bilirubin: 0.9 mg/dL (ref 0.3–1.2)
Total Protein: 7.6 g/dL (ref 6.5–8.1)

## 2023-02-16 LAB — CBC WITH DIFFERENTIAL/PLATELET
Abs Immature Granulocytes: 0.06 10*3/uL (ref 0.00–0.07)
Basophils Absolute: 0.1 10*3/uL (ref 0.0–0.1)
Basophils Relative: 0 %
Eosinophils Absolute: 0 10*3/uL (ref 0.0–0.5)
Eosinophils Relative: 0 %
HCT: 41.9 % (ref 36.0–46.0)
Hemoglobin: 13.4 g/dL (ref 12.0–15.0)
Immature Granulocytes: 0 %
Lymphocytes Relative: 3 %
Lymphs Abs: 0.5 10*3/uL — ABNORMAL LOW (ref 0.7–4.0)
MCH: 28.4 pg (ref 26.0–34.0)
MCHC: 32 g/dL (ref 30.0–36.0)
MCV: 88.8 fL (ref 80.0–100.0)
Monocytes Absolute: 0.8 10*3/uL (ref 0.1–1.0)
Monocytes Relative: 5 %
Neutro Abs: 15.3 10*3/uL — ABNORMAL HIGH (ref 1.7–7.7)
Neutrophils Relative %: 92 %
Platelets: 288 10*3/uL (ref 150–400)
RBC: 4.72 MIL/uL (ref 3.87–5.11)
RDW: 14.4 % (ref 11.5–15.5)
WBC: 16.7 10*3/uL — ABNORMAL HIGH (ref 4.0–10.5)
nRBC: 0 % (ref 0.0–0.2)

## 2023-02-16 LAB — URINALYSIS, W/ REFLEX TO CULTURE (INFECTION SUSPECTED)
Bilirubin Urine: NEGATIVE
Glucose, UA: NEGATIVE mg/dL
Ketones, ur: NEGATIVE mg/dL
Nitrite: POSITIVE — AB
Protein, ur: NEGATIVE mg/dL
RBC / HPF: >50 RBC/hpf (ref 0–5)
Specific Gravity, Urine: 1.015 (ref 1.005–1.030)
WBC, UA: >50 WBC/hpf (ref 0–5)
pH: 5 (ref 5.0–8.0)

## 2023-02-16 LAB — RESP PANEL BY RT-PCR (RSV, FLU A&B, COVID)  RVPGX2
Influenza A by PCR: NEGATIVE
Influenza B by PCR: NEGATIVE
Resp Syncytial Virus by PCR: NEGATIVE
SARS Coronavirus 2 by RT PCR: NEGATIVE

## 2023-02-16 LAB — LACTIC ACID, PLASMA
Lactic Acid, Venous: 3 mmol/L (ref 0.5–1.9)
Lactic Acid, Venous: 2.8 mmol/L (ref 0.5–1.9)

## 2023-02-16 MED ORDER — CEPHALEXIN 500 MG PO CAPS: 500.0000 mg | ORAL_CAPSULE | Freq: Three times a day (TID) | ORAL | 0 refills | Status: DC

## 2023-02-16 MED ORDER — SODIUM CHLORIDE 0.9 % IV SOLN
2.0000 g | Freq: Once | INTRAVENOUS | Status: AC
  Administered 2023-02-16: 2 g via INTRAVENOUS
  Filled 2023-02-16: qty 20

## 2023-02-16 MED ORDER — ONDANSETRON 4 MG PO TBDP: 4.0000 mg | ORAL_TABLET | Freq: Three times a day (TID) | ORAL | 0 refills | Status: DC | PRN

## 2023-02-16 MED ORDER — KETOROLAC TROMETHAMINE 15 MG/ML IJ SOLN
15.0000 mg | Freq: Once | INTRAMUSCULAR | Status: AC
  Administered 2023-02-16: 15 mg via INTRAVENOUS
  Filled 2023-02-16: qty 1

## 2023-02-16 MED ORDER — POTASSIUM CHLORIDE CRYS ER 20 MEQ PO TBCR
60.0000 meq | EXTENDED_RELEASE_TABLET | Freq: Once | ORAL | Status: AC
  Administered 2023-02-17: 60 meq via ORAL
  Filled 2023-02-16: qty 3

## 2023-02-16 MED ORDER — SODIUM CHLORIDE 0.9 % IV BOLUS
1000.0000 mL | Freq: Once | INTRAVENOUS | Status: AC
  Administered 2023-02-16: 1000 mL via INTRAVENOUS

## 2023-02-16 MED ORDER — IOHEXOL 350 MG/ML SOLN
100.0000 mL | Freq: Once | INTRAVENOUS | Status: AC | PRN
  Administered 2023-02-16: 100 mL via INTRAVENOUS

## 2023-02-16 MED ORDER — NAPROXEN 500 MG PO TABS: 500.0000 mg | ORAL_TABLET | Freq: Two times a day (BID) | ORAL | 0 refills | Status: DC

## 2023-02-16 NOTE — ED Triage Notes (Signed)
Pt sts that she has been having body aches and is really cold.

## 2023-02-16 NOTE — Discharge Instructions (Addendum)
Take antibiotic as prescribed. You may take medicines as needed for pain and nausea. Return to the ER for new symptoms, persistent vomiting, difficulty breathing or other concerns.

## 2023-02-16 NOTE — ED Notes (Signed)
Critical Result: Lactic 3.0   Derrill Kay, MD aware

## 2023-02-16 NOTE — ED Provider Notes (Signed)
[facility] Provider Note    Event Date/Time   First MD Initiated Contact with Patient 02/16/23 1853     (approximate)   History   Chief Complaint: Generalized Body Aches   HPI  Heidi Hicks is a 62 y.o. female with a past history of hypertension who comes ED complaining of bodyaches, chills, fatigue, headache starting yesterday.  No vomiting or diarrhea.  Normal oral intake.  Also reports some dysuria.  No flank pain or back pain.  No dizziness or syncope, no chest pain or shortness of breath.     Physical Exam   Triage Vital Signs: ED Triage Vitals  Enc Vitals Group     BP 02/16/23 1619 (!) 148/83     Pulse Rate 02/16/23 1619 (!) 129     Resp 02/16/23 1619 (!) 22     Temp 02/16/23 1619 (!) 100.4 F (38 C)     Temp Source 02/16/23 1619 Oral     SpO2 02/16/23 1619 95 %     Weight 02/16/23 1620 285 lb (129.3 kg)     Height 02/16/23 1620 5\' 9"  (1.753 m)     Head Circumference --      Peak Flow --      Pain Score 02/16/23 1620 7     Pain Loc --      Pain Edu? --      Excl. in [place]? --     Most recent vital signs: Vitals:   02/16/23 2215 02/16/23 2225  BP:    Pulse: (!) 111   Resp: (!) 33   Temp:  99.1 F (37.3 C)  SpO2: 97%     General: Awake, no distress.  CV:  Good peripheral perfusion.  Tachycardia heart rate 120 Resp:  Normal effort.  Clear to auscultation bilaterally Abd:  No distention.  Soft with suprapubic and right lower quadrant tenderness.  No CVA tenderness. Other:  Somewhat dry mucous membranes.  No rash.   ED Results / Procedures / Treatments   Labs (all labs ordered are listed, but only abnormal results are displayed) Labs Reviewed  LACTIC ACID, PLASMA - Abnormal; Notable for the following components:      Result Value   Lactic Acid, Venous 3.0 (*)    All other components within normal limits  LACTIC ACID, PLASMA - Abnormal; Notable for the following components:   Lactic Acid, Venous 2.8 (*)    All other  components within normal limits  COMPREHENSIVE METABOLIC PANEL - Abnormal; Notable for the following components:   Potassium 3.0 (*)    CO2 19 (*)    Glucose, Bld 102 (*)    Creatinine, Ser 1.15 (*)    Calcium 8.7 (*)    GFR, Estimated 55 (*)    All other components within normal limits  CBC WITH DIFFERENTIAL/PLATELET - Abnormal; Notable for the following components:   WBC 16.7 (*)    Neutro Abs 15.3 (*)    Lymphs Abs 0.5 (*)    All other components within normal limits  URINALYSIS, W/ REFLEX TO CULTURE (INFECTION SUSPECTED) - Abnormal; Notable for the following components:   Color, Urine YELLOW (*)    APPearance HAZY (*)    Hgb urine dipstick LARGE (*)    Nitrite POSITIVE (*)    Leukocytes,Ua SMALL (*)    Bacteria, UA RARE (*)    All other components within normal limits  RESP PANEL BY RT-PCR (RSV, FLU A&B, COVID)  [id number]  CULTURE, BLOOD (ROUTINE  X 2)  CULTURE, BLOOD (ROUTINE X 2)     EKG Interpreted by me Sinus tachycardia rate 127.  Left axis, normal intervals.  Normal QRS ST segments and T waves.   RADIOLOGY Chest x-ray interpreted by me, negative for pneumonia.  Radiology report reviewed.  CT abdomen pelvis unremarkable   PROCEDURES:  Procedures   MEDICATIONS ORDERED IN ED: Medications  cefTRIAXone (ROCEPHIN) 2 g in sodium chloride 0.9 % 100 mL IVPB (2 g Intravenous New Bag/Given 02/16/23 2325)  sodium chloride 0.9 % bolus 1,000 mL (0 mLs Intravenous Stopped 02/16/23 2225)  ketorolac (TORADOL) 15 MG/ML injection 15 mg (15 mg Intravenous Given 02/16/23 2029)  iohexol (OMNIPAQUE) 350 MG/ML injection 100 mL (100 mLs Intravenous Contrast Given 02/16/23 2100)  sodium chloride 0.9 % bolus 1,000 mL (1,000 mLs Intravenous New Bag/Given 02/16/23 2325)     IMPRESSION / MDM / ASSESSMENT AND PLAN / ED COURSE  I reviewed the triage vital signs and the nursing notes.  DDx: Influenza, COVID, UTI, pneumonia, viral illness, AKI, electrolyte abnormality, anemia  Patient's  presentation is most consistent with acute presentation with potential threat to life or bodily function.  Patient presents with fever tachycardia initial labs showing signs of UTI.  Her symptoms are more suggestive of influenza-like illness.  COVID flu RSV negative.  Will check CT abdomen pelvis to evaluate for appendicitis or pyelonephritis.  ----------------------------------------- 11:29 PM on 02/16/2023 ----------------------------------------- CT unremarkable.  Workup is all reassuring except for urinalysis showing signs of a UTI.  She has a low-grade fever.  She she presented with tachycardia, but she is nontoxic well-appearing and feeling better after fluids.  Will give additional fluids, Rocephin, and discharged home.  Offered admission but she feels well and manageable and I do not think that she is septic clinically.       FINAL CLINICAL IMPRESSION(S) / ED DIAGNOSES   Final diagnoses:  Cystitis     Rx / DC Orders   ED Discharge Orders          Ordered    cephALEXin (KEFLEX) 500 MG capsule  3 times daily        02/16/23 2315    naproxen (NAPROSYN) 500 MG tablet  2 times daily with meals        02/16/23 2315    ondansetron (ZOFRAN-ODT) 4 MG disintegrating tablet  Every 8 hours PRN        02/16/23 2315             Note:  This document was prepared using Dragon voice recognition software and may include unintentional dictation errors.   Sharman Cheek, MD 02/16/23 2330

## 2023-02-17 ENCOUNTER — Telehealth: Payer: Self-pay | Admitting: Emergency Medicine

## 2023-02-17 ENCOUNTER — Other Ambulatory Visit: Payer: Self-pay

## 2023-02-17 ENCOUNTER — Inpatient Hospital Stay
Admission: EM | Admit: 2023-02-17 | Discharge: 2023-02-19 | DRG: 872 | Disposition: A | Discharge status: Home / Self Care | Payer: BC Managed Care – PPO | Attending: Internal Medicine | Admitting: Internal Medicine

## 2023-02-17 DIAGNOSIS — A4151 Sepsis due to Escherichia coli [E. coli]: Principal | ICD-10-CM | POA: Diagnosis present

## 2023-02-17 DIAGNOSIS — I452 Bifascicular block: Secondary | ICD-10-CM | POA: Diagnosis present

## 2023-02-17 DIAGNOSIS — R5383 Other fatigue: Secondary | ICD-10-CM | POA: Diagnosis present

## 2023-02-17 DIAGNOSIS — Z981 Arthrodesis status: Secondary | ICD-10-CM | POA: Diagnosis not present

## 2023-02-17 DIAGNOSIS — N3000 Acute cystitis without hematuria: Secondary | ICD-10-CM | POA: Diagnosis present

## 2023-02-17 DIAGNOSIS — R7881 Bacteremia: Secondary | ICD-10-CM | POA: Diagnosis present

## 2023-02-17 DIAGNOSIS — Z79899 Other long term (current) drug therapy: Secondary | ICD-10-CM | POA: Diagnosis not present

## 2023-02-17 DIAGNOSIS — Z6841 Body Mass Index (BMI) 40.0 and over, adult: Secondary | ICD-10-CM | POA: Diagnosis not present

## 2023-02-17 DIAGNOSIS — A419 Sepsis, unspecified organism: Secondary | ICD-10-CM | POA: Diagnosis present

## 2023-02-17 DIAGNOSIS — Z8249 Family history of ischemic heart disease and other diseases of the circulatory system: Secondary | ICD-10-CM | POA: Diagnosis not present

## 2023-02-17 DIAGNOSIS — Z1152 Encounter for screening for COVID-19: Secondary | ICD-10-CM

## 2023-02-17 DIAGNOSIS — M1990 Unspecified osteoarthritis, unspecified site: Secondary | ICD-10-CM | POA: Diagnosis present

## 2023-02-17 DIAGNOSIS — Z888 Allergy status to other drugs, medicaments and biological substances status: Secondary | ICD-10-CM | POA: Diagnosis not present

## 2023-02-17 DIAGNOSIS — I10 Essential (primary) hypertension: Secondary | ICD-10-CM | POA: Diagnosis present

## 2023-02-17 DIAGNOSIS — N390 Urinary tract infection, site not specified: Secondary | ICD-10-CM | POA: Diagnosis present

## 2023-02-17 DIAGNOSIS — B9620 Unspecified Escherichia coli [E. coli] as the cause of diseases classified elsewhere: Secondary | ICD-10-CM | POA: Diagnosis present

## 2023-02-17 DIAGNOSIS — E876 Hypokalemia: Secondary | ICD-10-CM | POA: Diagnosis present

## 2023-02-17 HISTORY — DX: Other disorders of phosphorus metabolism: E83.39

## 2023-02-17 LAB — PROCALCITONIN: Procalcitonin: 28.56 ng/mL

## 2023-02-17 LAB — BLOOD CULTURE ID PANEL (REFLEXED) - BCID2

## 2023-02-17 LAB — URINALYSIS, W/ REFLEX TO CULTURE (INFECTION SUSPECTED)
Bilirubin Urine: NEGATIVE
Glucose, UA: NEGATIVE mg/dL
Ketones, ur: NEGATIVE mg/dL
Nitrite: NEGATIVE
Protein, ur: 100 mg/dL — AB
Specific Gravity, Urine: 1.028 (ref 1.005–1.030)
WBC, UA: >50 WBC/hpf (ref 0–5)
pH: 6 (ref 5.0–8.0)

## 2023-02-17 LAB — URINE CULTURE

## 2023-02-17 LAB — CBC WITH DIFFERENTIAL/PLATELET
Abs Immature Granulocytes: 0.04 10*3/uL (ref 0.00–0.07)
Basophils Absolute: 0 10*3/uL (ref 0.0–0.1)
Basophils Relative: 0 %
Eosinophils Absolute: 0 10*3/uL (ref 0.0–0.5)
Eosinophils Relative: 0 %
HCT: 37.2 % (ref 36.0–46.0)
Hemoglobin: 12.1 g/dL (ref 12.0–15.0)
Immature Granulocytes: 0 %
Lymphocytes Relative: 10 %
Lymphs Abs: 1.2 10*3/uL (ref 0.7–4.0)
MCH: 28.9 pg (ref 26.0–34.0)
MCHC: 32.5 g/dL (ref 30.0–36.0)
MCV: 89 fL (ref 80.0–100.0)
Monocytes Absolute: 1.2 10*3/uL — ABNORMAL HIGH (ref 0.1–1.0)
Monocytes Relative: 9 %
Neutro Abs: 10.2 10*3/uL — ABNORMAL HIGH (ref 1.7–7.7)
Neutrophils Relative %: 81 %
Platelets: 251 10*3/uL (ref 150–400)
RBC: 4.18 MIL/uL (ref 3.87–5.11)
RDW: 14.9 % (ref 11.5–15.5)
WBC: 12.7 10*3/uL — ABNORMAL HIGH (ref 4.0–10.5)
nRBC: 0 % (ref 0.0–0.2)

## 2023-02-17 LAB — LACTIC ACID, PLASMA: Lactic Acid, Venous: 1.2 mmol/L (ref 0.5–1.9)

## 2023-02-17 LAB — PHOSPHORUS: Phosphorus: 2.4 mg/dL — ABNORMAL LOW (ref 2.5–4.6)

## 2023-02-17 LAB — COMPREHENSIVE METABOLIC PANEL
ALT: 26 U/L (ref 0–44)
AST: 37 U/L (ref 15–41)
Albumin: 3.2 g/dL — ABNORMAL LOW (ref 3.5–5.0)
Alkaline Phosphatase: 97 U/L (ref 38–126)
Anion gap: 7 (ref 5–15)
BUN: 14 mg/dL (ref 6–20)
CO2: 23 mmol/L (ref 22–32)
Calcium: 7.9 mg/dL — ABNORMAL LOW (ref 8.9–10.3)
Chloride: 110 mmol/L (ref 98–111)
Creatinine, Ser: 1.06 mg/dL — ABNORMAL HIGH (ref 0.44–1.00)
GFR, Estimated: >60 mL/min (ref >60)
Glucose, Bld: 122 mg/dL — ABNORMAL HIGH (ref 70–99)
Potassium: 3 mmol/L — ABNORMAL LOW (ref 3.5–5.1)
Sodium: 140 mmol/L (ref 135–145)
Total Bilirubin: 0.8 mg/dL (ref 0.3–1.2)
Total Protein: 6.6 g/dL (ref 6.5–8.1)

## 2023-02-17 LAB — MAGNESIUM: Magnesium: 2 mg/dL (ref 1.7–2.4)

## 2023-02-17 MED ORDER — NAPROXEN 500 MG PO TABS
500.0000 mg | ORAL_TABLET | Freq: Two times a day (BID) | ORAL | Status: DC
  Administered 2023-02-17 – 2023-02-19 (×4): 500 mg via ORAL
  Filled 2023-02-17 (×5): qty 1

## 2023-02-17 MED ORDER — SODIUM CHLORIDE 0.9 % IV SOLN
2.0000 g | INTRAVENOUS | Status: DC
  Administered 2023-02-18 – 2023-02-19 (×2): 2 g via INTRAVENOUS
  Filled 2023-02-17 (×2): qty 20

## 2023-02-17 MED ORDER — ONDANSETRON HCL 4 MG/2ML IJ SOLN: 4.0000 mg | Freq: Three times a day (TID) | INTRAMUSCULAR | Status: DC | PRN

## 2023-02-17 MED ORDER — POTASSIUM PHOSPHATES 15 MMOLE/5ML IV SOLN
15.0000 mmol | Freq: Once | INTRAVENOUS | Status: AC
  Administered 2023-02-17: 15 mmol via INTRAVENOUS
  Filled 2023-02-17: qty 5

## 2023-02-17 MED ORDER — ENOXAPARIN SODIUM 60 MG/0.6ML IJ SOSY
60.0000 mg | PREFILLED_SYRINGE | INTRAMUSCULAR | Status: DC
  Administered 2023-02-17 – 2023-02-18 (×2): 60 mg via SUBCUTANEOUS
  Filled 2023-02-17 (×2): qty 0.6

## 2023-02-17 MED ORDER — POTASSIUM CHLORIDE CRYS ER 20 MEQ PO TBCR
40.0000 meq | EXTENDED_RELEASE_TABLET | Freq: Once | ORAL | Status: AC
  Administered 2023-02-17: 40 meq via ORAL
  Filled 2023-02-17: qty 2

## 2023-02-17 MED ORDER — HYDRALAZINE HCL 20 MG/ML IJ SOLN: 5.0000 mg | INTRAMUSCULAR | Status: DC | PRN

## 2023-02-17 MED ORDER — LACTATED RINGERS IV BOLUS
1000.0000 mL | Freq: Once | INTRAVENOUS | Status: AC
  Administered 2023-02-17: 1000 mL via INTRAVENOUS

## 2023-02-17 MED ORDER — SODIUM CHLORIDE 0.9 % IV SOLN
2.0000 g | Freq: Once | INTRAVENOUS | Status: AC
  Administered 2023-02-17: 2 g via INTRAVENOUS
  Filled 2023-02-17: qty 20

## 2023-02-17 MED ORDER — SODIUM CHLORIDE 0.9 % IV BOLUS
1000.0000 mL | Freq: Once | INTRAVENOUS | Status: AC
  Administered 2023-02-17: 1000 mL via INTRAVENOUS

## 2023-02-17 MED ORDER — SODIUM CHLORIDE 0.9 % IV SOLN: INTRAVENOUS | Status: DC

## 2023-02-17 MED ORDER — ACETAMINOPHEN 325 MG PO TABS: 650.0000 mg | ORAL_TABLET | Freq: Four times a day (QID) | ORAL | Status: DC | PRN

## 2023-02-17 NOTE — ED Triage Notes (Signed)
Pt presents to ED with c/o of being called back with a + blood culture. Pt states DX'ed with a "bladder infection". Pt is A&Ox4.

## 2023-02-17 NOTE — H&P (Signed)
History and Physical    Heidi Hicks AOZ:[id number] DOB: [birth year] DOA: 02/17/2023  Referring MD/NP/PA:   PCP: Glori Luis, MD   Patient coming from:  The patient is coming from home.     Chief Complaint: Bacteremia  HPI: Heidi Hicks is a 62 y.o. female with medical history significant of hypertension, obesity, psoriasis, arthritis, who presents with bacteremia.  Patient was seen in the ED yesterday due to UTI, patient was started on Keflex.  CT of abdomen/pelvis was negative.  Today patient is called back since patient's blood culture is positive for E. Coli.  Patient states that she still has increased urinary frequency, but her previous dysuria and burning or urination have improved now.  She has fever, no chills.  Her temperature is 100.4 in ED.  Denies nausea vomiting, diarrhea or abdominal pain.  No chest pain, cough, shortness of breath.  Data reviewed independently and ED Course: pt was found to have WBC 12.7, lactic acid 1.2 (2.8 yesterday), GFR> 60, potassium 3.0, magnesium 2.0, phosphorus 2.4.  Positive urinalysis (hazy appearance, moderate amount leukocyte, rare bacteria, WBC> 50).  Temperature 100.4, blood pressure 121/75, heart rate 129, RR 33, oxygen saturation 98% on room air.  Patient is admitted to telemetry bed as inpatient.   EKG: I have personally reviewed.  Sinus rhythm, QTc 465, bifascicular block, poor R wave progression   Review of Systems:   General: Has fevers, no chills, no body weight gain, has fatigue HEENT: no blurry vision, hearing changes or sore throat Respiratory: no dyspnea, coughing, wheezing CV: no chest pain, no palpitations GI: no nausea, vomiting, abdominal pain, diarrhea, constipation GU: no dysuria, burning on urination, has increased urinary frequency, no hematuria  Ext: no leg edema Neuro: no unilateral weakness, numbness, or tingling, no vision change or hearing loss Skin: no rash, no skin tear. MSK: No muscle spasm, no  deformity, no limitation of range of movement in spin Heme: No easy bruising.  Travel history: No recent long distant travel.   Allergy:  Allergies  Allergen Reactions   Ace Inhibitors Cough    No cough or complaints with Losartan.     Past Medical History:  Diagnosis Date   Arthritis    Hypertension     Past Surgical History:  Procedure Laterality Date   abkle fusion  1990   BREAST SURGERY  2002   reduction   broken ankle  1988   CESAREAN SECTION     1981, 1989, 1997   COLONOSCOPY     GALLBLADDER SURGERY  2009   HYSTEROSCOPY WITH D & C N/A 09/03/2020   Procedure: DILATATION AND CURETTAGE /HYSTEROSCOPY, polypectomy;  Surgeon: Natale Milch, MD;  Location: [facility];  Service: Gynecology;  Laterality: N/A;   POLYPECTOMY     REDUCTION MAMMAPLASTY Bilateral 2002   anchor reduction scars   TONSILLECTOMY AND ADENOIDECTOMY  1971    Social History:  reports that she has never smoked. She has never used smokeless tobacco. She reports that she does not drink alcohol and does not use drugs.  Family History:  Family History  Problem Relation Age of Onset   Hypertension Mother    Diabetes Mother    Renal cancer Mother 17   Hypertension Father    Diabetes Father    Diabetes Sister    Colon polyps Neg Hx    Esophageal cancer Neg Hx    Rectal cancer Neg Hx    Stomach cancer Neg Hx    Colon cancer  Neg Hx    Pancreatic cancer Neg Hx    Liver disease Neg Hx      Prior to Admission medications   Medication Sig Start Date End Date Taking? Authorizing Provider  cephALEXin (KEFLEX) 500 MG capsule Take 1 capsule (500 mg total) by mouth 3 (three) times daily for 7 days. 02/16/23 02/23/23 Yes Sharman Cheek, MD  losartan-hydrochlorothiazide (HYZAAR) 50-12.5 MG tablet TAKE 1 TABLET BY MOUTH DAILY Patient taking differently: Take 1 tablet by mouth daily. 02/16/23  Yes Glori Luis, MD  meloxicam (MOBIC) 7.5 MG tablet TAKE 1 TABLET(7.5 MG) BY MOUTH DAILY. Patient taking  differently: Take 7.5 mg by mouth daily. 12/26/22  Yes Glori Luis, MD  naproxen (NAPROSYN) 500 MG tablet Take 1 tablet (500 mg total) by mouth 2 (two) times daily with a meal. 02/16/23  Yes Sharman Cheek, MD  ondansetron (ZOFRAN-ODT) 4 MG disintegrating tablet Take 1 tablet (4 mg total) by mouth every 8 (eight) hours as needed for nausea or vomiting. 02/16/23  Yes Sharman Cheek, MD    Physical Exam: Vitals:   02/17/23 1136 02/17/23 1648  BP: 121/75 115/78  Pulse: 91 81  Resp: 20 18  Temp: 98.3 F (36.8 C) 98 F (36.7 C)  TempSrc: Oral Oral  SpO2: 98% 100%   General: Not in acute distress HEENT:       Eyes: PERRL, EOMI, no jaundice       ENT: No discharge from the ears and nose, no pharynx injection, no tonsillar enlargement.        Neck: No JVD, no bruit, no mass felt. Heme: No neck lymph node enlargement. Cardiac: S1/S2, RRR, No murmurs, No gallops or rubs. Respiratory: No rales, wheezing, rhonchi or rubs. GI: Soft, nondistended, nontender, no rebound pain, no organomegaly, BS present. GU: No hematuria Ext: No pitting leg edema bilaterally. 1+DP/PT pulse bilaterally. Musculoskeletal: No joint deformities, No joint redness or warmth, no limitation of ROM in spin. Skin: No rashes.  Neuro: Alert, oriented X3, cranial nerves II-XII grossly intact, moves all extremities normally. Psych: Patient is not psychotic, no suicidal or hemocidal ideation.  Labs on Admission: I have personally reviewed following labs and imaging studies  CBC: Recent Labs  Lab 02/16/23 1735 02/17/23 1239  WBC 16.7* 12.7*  NEUTROABS 15.3* 10.2*  HGB 13.4 12.1  HCT 41.9 37.2  MCV 88.8 89.0  PLT 288 251   Basic Metabolic Panel: Recent Labs  Lab 02/16/23 1735 02/17/23 1237 02/17/23 1239  NA 136  --  140  K 3.0*  --  3.0*  CL 104  --  110  CO2 19*  --  23  GLUCOSE 102*  --  122*  BUN 16  --  14  CREATININE 1.15*  --  1.06*  CALCIUM 8.7*  --  7.9*  MG  --  2.0  --   PHOS  --  2.4*   --    GFR: Estimated Creatinine Clearance: 81.4 mL/min (A) (by C-G formula based on SCr of 1.06 mg/dL (H)). Liver Function Tests: Recent Labs  Lab 02/16/23 1735 02/17/23 1239  AST 31 37  ALT 20 26  ALKPHOS 99 97  BILITOT 0.9 0.8  PROT 7.6 6.6  ALBUMIN 4.1 3.2*   No results for input(s): "LIPASE", "AMYLASE" in the last 168 hours. No results for input(s): "AMMONIA" in the last 168 hours. Coagulation Profile: No results for input(s): "INR", "PROTIME" in the last 168 hours. Cardiac Enzymes: No results for input(s): "CKTOTAL", "CKMB", "CKMBINDEX", "TROPONINI" in  the last 168 hours. BNP (last 3 results) No results for input(s): "PROBNP" in the last 8760 hours. HbA1C: No results for input(s): "HGBA1C" in the last 72 hours. CBG: No results for input(s): "GLUCAP" in the last 168 hours. Lipid Profile: No results for input(s): "CHOL", "HDL", "LDLCALC", "TRIG", "CHOLHDL", "LDLDIRECT" in the last 72 hours. Thyroid Function Tests: No results for input(s): "TSH", "T4TOTAL", "FREET4", "T3FREE", "THYROIDAB" in the last 72 hours. Anemia Panel: No results for input(s): "VITAMINB12", "FOLATE", "FERRITIN", "TIBC", "IRON", "RETICCTPCT" in the last 72 hours. Urine analysis:    Component Value Date/Time   COLORURINE YELLOW (A) 02/17/2023 1239   APPEARANCEUR HAZY (A) 02/17/2023 1239   LABSPEC 1.028 02/17/2023 1239   PHURINE 6.0 02/17/2023 1239   GLUCOSEU NEGATIVE 02/17/2023 1239   HGBUR MODERATE (A) 02/17/2023 1239   BILIRUBINUR NEGATIVE 02/17/2023 1239   KETONESUR NEGATIVE 02/17/2023 1239   PROTEINUR 100 (A) 02/17/2023 1239   NITRITE NEGATIVE 02/17/2023 1239   LEUKOCYTESUR MODERATE (A) 02/17/2023 1239   Sepsis Labs: @LABRCNTIP (procalcitonin:4,lacticidven:4) ) Recent Results (from the past 240 hour(s))  Resp panel by RT-PCR (RSV, Flu A&B, Covid) Anterior Nasal Swab     Status: None   Collection Time: 02/16/23  4:22 PM   Specimen: Anterior Nasal Swab  Result Value Ref Range Status    SARS Coronavirus 2 by RT PCR NEGATIVE NEGATIVE Final    Comment: (NOTE) SARS-CoV-2 target nucleic acids are NOT DETECTED.  The SARS-CoV-2 RNA is generally detectable in upper respiratory specimens during the acute phase of infection. The lowest concentration of SARS-CoV-2 viral copies this assay can detect is 138 copies/mL. A negative result does not preclude SARS-Cov-2 infection and should not be used as the sole basis for treatment or other patient management decisions. A negative result may occur with  improper specimen collection/handling, submission of specimen other than nasopharyngeal swab, presence of viral mutation(s) within the areas targeted by this assay, and inadequate number of viral copies(<138 copies/mL). A negative result must be combined with clinical observations, patient history, and epidemiological information. The expected result is Negative.  Fact Sheet for Patients:  BloggerCourse.com  Fact Sheet for Healthcare Providers:  SeriousBroker.it  This test is no t yet approved or cleared by the [place] FDA and  has been authorized for detection and/or diagnosis of SARS-CoV-2 by FDA under an Emergency Use Authorization (EUA). This EUA will remain  in effect (meaning this test can be used) for the duration of the COVID-19 declaration under Section 564(b)(1) of the Act, 21 U.S.C.section 360bbb-3(b)(1), unless the authorization is terminated  or revoked sooner.       Influenza A by PCR NEGATIVE NEGATIVE Final   Influenza B by PCR NEGATIVE NEGATIVE Final    Comment: (NOTE) The Xpert Xpress SARS-CoV-2/FLU/RSV plus assay is intended as an aid in the diagnosis of influenza from Nasopharyngeal swab specimens and should not be used as a sole basis for treatment. Nasal washings and aspirates are unacceptable for Xpert Xpress SARS-CoV-2/FLU/RSV testing.  Fact Sheet for  Patients: BloggerCourse.com  Fact Sheet for Healthcare Providers: SeriousBroker.it  This test is not yet approved or cleared by the [place] FDA and has been authorized for detection and/or diagnosis of SARS-CoV-2 by FDA under an Emergency Use Authorization (EUA). This EUA will remain in effect (meaning this test can be used) for the duration of the COVID-19 declaration under Section 564(b)(1) of the Act, 21 U.S.C. section 360bbb-3(b)(1), unless the authorization is terminated or revoked.     Resp Syncytial Virus by  PCR NEGATIVE NEGATIVE Final    Comment: (NOTE) Fact Sheet for Patients: BloggerCourse.com  Fact Sheet for Healthcare Providers: SeriousBroker.it  This test is not yet approved or cleared by the [place] FDA and has been authorized for detection and/or diagnosis of SARS-CoV-2 by FDA under an Emergency Use Authorization (EUA). This EUA will remain in effect (meaning this test can be used) for the duration of the COVID-19 declaration under Section 564(b)(1) of the Act, 21 U.S.C. section 360bbb-3(b)(1), unless the authorization is terminated or revoked.  Performed at [facility], [address] [place], [place] [address]   Blood Culture (routine x 2)     Status: None (Preliminary result)   Collection Time: 02/16/23  5:35 PM   Specimen: BLOOD RIGHT ARM  Result Value Ref Range Status   Specimen Description   Final    BLOOD RIGHT ARM Performed at [facility], [address], [place], [place] [address]    Special Requests   Final    BOTTLES DRAWN AEROBIC AND ANAEROBIC Blood Culture adequate volume Performed at [facility], [address] [place], [place] [address]    Culture  Setup Time   Final    GRAM NEGATIVE RODS ANAEROBIC BOTTLE ONLY CRITICAL RESULT CALLED TO, READ BACK BY AND VERIFIED WITH: ASHLEY ORSUTO AT 0435  02/17/23.PMF Performed at [facility], [address], [place], [place] [address]    Culture GRAM NEGATIVE RODS  Final   Report Status PENDING  Incomplete  Blood Culture (routine x 2)     Status: None (Preliminary result)   Collection Time: 02/16/23  5:35 PM   Specimen: BLOOD LEFT ARM  Result Value Ref Range Status   Specimen Description BLOOD LEFT ARM  Final   Special Requests   Final    BOTTLES DRAWN AEROBIC AND ANAEROBIC Blood Culture adequate volume   Culture   Final    NO GROWTH < 12 HOURS Performed at [facility], [address] [place], [place] [address]    Report Status PENDING  Incomplete  Blood Culture ID Panel (Reflexed)     Status: Abnormal   Collection Time: 02/16/23  5:35 PM  Result Value Ref Range Status   Enterococcus faecalis NOT DETECTED NOT DETECTED Final   Enterococcus Faecium NOT DETECTED NOT DETECTED Final   Listeria monocytogenes NOT DETECTED NOT DETECTED Final   Staphylococcus species NOT DETECTED NOT DETECTED Final   Staphylococcus aureus (BCID) NOT DETECTED NOT DETECTED Final   Staphylococcus epidermidis NOT DETECTED NOT DETECTED Final   Staphylococcus lugdunensis NOT DETECTED NOT DETECTED Final   Streptococcus species NOT DETECTED NOT DETECTED Final   Streptococcus agalactiae NOT DETECTED NOT DETECTED Final   Streptococcus pneumoniae NOT DETECTED NOT DETECTED Final   Streptococcus pyogenes NOT DETECTED NOT DETECTED Final   A.calcoaceticus-baumannii NOT DETECTED NOT DETECTED Final   Bacteroides fragilis NOT DETECTED NOT DETECTED Final   Enterobacterales DETECTED (A) NOT DETECTED Final    Comment: Enterobacterales represent a large order of gram negative bacteria, not a single organism. CRITICAL RESULT CALLED TO, READ BACK BY AND VERIFIED WITH: ASHLEY ORSUTO AT 0435 02/17/23.PMF    Enterobacter cloacae complex NOT DETECTED NOT DETECTED Final   Escherichia coli DETECTED (A) NOT DETECTED Final    Comment: CRITICAL RESULT CALLED TO, READ  BACK BY AND VERIFIED WITH: ASHLEY ORSUTO AT 0435 02/17/23.PMF    Klebsiella aerogenes NOT DETECTED NOT DETECTED Final   Klebsiella oxytoca NOT DETECTED NOT DETECTED Final   Klebsiella pneumoniae  NOT DETECTED NOT DETECTED Final   Proteus species NOT DETECTED NOT DETECTED Final   Salmonella species NOT DETECTED NOT DETECTED Final   Serratia marcescens NOT DETECTED NOT DETECTED Final   Haemophilus influenzae NOT DETECTED NOT DETECTED Final   Neisseria meningitidis NOT DETECTED NOT DETECTED Final   Pseudomonas aeruginosa NOT DETECTED NOT DETECTED Final   Stenotrophomonas maltophilia NOT DETECTED NOT DETECTED Final   Candida albicans NOT DETECTED NOT DETECTED Final   Candida auris NOT DETECTED NOT DETECTED Final   Candida glabrata NOT DETECTED NOT DETECTED Final   Candida krusei NOT DETECTED NOT DETECTED Final   Candida parapsilosis NOT DETECTED NOT DETECTED Final   Candida tropicalis NOT DETECTED NOT DETECTED Final   Cryptococcus neoformans/gattii NOT DETECTED NOT DETECTED Final   CTX-M ESBL NOT DETECTED NOT DETECTED Final   Carbapenem resistance IMP NOT DETECTED NOT DETECTED Final   Carbapenem resistance KPC NOT DETECTED NOT DETECTED Final   Carbapenem resistance NDM NOT DETECTED NOT DETECTED Final   Carbapenem resist OXA 48 LIKE NOT DETECTED NOT DETECTED Final   Carbapenem resistance VIM NOT DETECTED NOT DETECTED Final    Comment: Performed at [facility], [address], [place], [place] [address]  Urine Culture     Status: None (Preliminary result)   Collection Time: 02/17/23 12:39 PM   Specimen: Urine, Random  Result Value Ref Range Status   Specimen Description   Final    URINE, RANDOM Performed at [facility], [address] [place], [place] [address]    Special Requests   Final    URINE, CLEAN CATCH Performed at [facility], [address], [place], [place] [address]    Culture PENDING  Incomplete   Report Status PENDING  Incomplete      Radiological Exams on Admission: CT ABDOMEN PELVIS W CONTRAST  Result Date: 02/16/2023 CLINICAL DATA:  Right lower quadrant abdominal pain.  Body aches. EXAM: CT ABDOMEN AND PELVIS WITH CONTRAST TECHNIQUE: Multidetector CT imaging of the abdomen and pelvis was performed using the standard protocol following bolus administration of intravenous contrast. RADIATION DOSE REDUCTION: This exam was performed according to the departmental dose-optimization program which includes automated exposure control, adjustment of the mA and/or kV according to patient size and/or use of iterative reconstruction technique. CONTRAST:  OMNIPAQUE IOHEXOL 350 MG/ML SOLN COMPARISON:  CT abdomen pelvis dated August 08, 2020. FINDINGS: Lower chest: No acute abnormality. Left-greater-than-right basilar scarring/atelectasis. Hepatobiliary: No focal liver abnormality is seen. Status post cholecystectomy. No biliary dilatation. Pancreas: Unremarkable. No pancreatic ductal dilatation or surrounding inflammatory changes. Spleen: Normal in size without focal abnormality. Adrenals/Urinary Tract: Adrenal glands are unremarkable. Kidneys are normal, without renal calculi, solid lesion, or hydronephrosis. Small simple cyst in the upper pole of the right kidney has slightly increased in size since the prior study. No follow-up imaging is recommended. Bladder is unremarkable. Stomach/Bowel: Stomach is within normal limits. Appendix appears normal. No evidence of bowel wall thickening, distention, or inflammatory changes. Vascular/Lymphatic: No significant vascular findings are present. No enlarged abdominal or pelvic lymph nodes. Reproductive: Uterus and bilateral adnexa are unremarkable. Other: Small fat containing umbilical hernia. No free fluid or pneumoperitoneum. Musculoskeletal: No acute or significant osseous findings. IMPRESSION: 1. No acute intra-abdominal process. Normal appendix. Electronically Signed   By: Obie Dredge M.D.    On: 02/16/2023 21:33   DG Chest Port 1 View  Result Date: 02/16/2023 CLINICAL DATA:  Questionable sepsis. EXAM: PORTABLE CHEST 1 VIEW COMPARISON:  None Available. FINDINGS:  No focal consolidation, pleural effusion, or pneumothorax. The cardiac silhouette is within normal limits. No acute osseous pathology. IMPRESSION: No active disease. Electronically Signed   By: Elgie Collard M.D.   On: 02/16/2023 17:47      Assessment/Plan Principal Problem:   E coli bacteremia Active Problems:   UTI (urinary tract infection)   Sepsis (HCC)   Hypertension   Hypokalemia   Hypophosphatemia   Morbid obesity with BMI of 40.0-44.9, adult (HCC)   Assessment and Plan:  E coli bacteremia and sepsis due to UTI: Patient meets criteria for sepsis with WBC 12.7, heart rate up to 129, RR 23.  Lactic acid is normal 1.2 (lactic acid was 2.8 yesterday).  -Admitted to telemetry bed as inpatient -IV Rocephin -Follow-up urine culture and blood culture -IV fluid: 2 L LR, then 75 cc/h of normal saline -Check procalcitonin level  Hypertension -Hold Hyzaar since patient at risk for developing hypotension due to sepsis -IV hydralazine as needed  Hypokalemia and Hypophosphatemia: Potassium 3.0, phosphorus 2.4, magnesium 2.0 -Repleted potassium and phosphorus  Morbid obesity with BMI of 40.0-44.9, adult (HCC): Body weight 129.3 kg, BMI 42.09 -Encourage losing weight -Exercise and healthy diet      DVT ppx:  SQ Lovenox  Code Status: Full code   Family Communication:   Yes, patient's son and daughter  at bed side.    Disposition Plan:  Anticipate discharge back to previous environment  Consults called:  none  Admission status and Level of care: Telemetry Medical:     as inpt      Dispo: The patient is from: Home              Anticipated d/c is to: Home              Anticipated d/c date is: 2 days              Patient currently is not medically stable to d/c.    Severity of Illness:  The  appropriate patient status for this patient is INPATIENT. Inpatient status is judged to be reasonable and necessary in order to provide the required intensity of service to ensure the patient's safety. The patient's presenting symptoms, physical exam findings, and initial radiographic and laboratory data in the context of their chronic comorbidities is felt to place them at high risk for further clinical deterioration. Furthermore, it is not anticipated that the patient will be medically stable for discharge from the hospital within 2 midnights of admission.   * I certify that at the point of admission it is my clinical judgment that the patient will require inpatient hospital care spanning beyond 2 midnights from the point of admission due to high intensity of service, high risk for further deterioration and high frequency of surveillance required.*       Date of Service 02/17/2023    Lorretta Harp [facility]   If 7PM-7AM, please contact night-coverage [email or web address] 02/17/2023, 5:52 PM

## 2023-02-17 NOTE — Progress Notes (Signed)
Pt arrived on the unit at 1648.

## 2023-02-17 NOTE — Telephone Encounter (Signed)
Received call from the lab at 0440 that patient's blood culture anaerobic 1 of 4 was positive for gram neg rods and enterbacteralis and e coli.  Message sent to Dr. Katrinka Blazing to inform him of this and was told to call pt to have her return to the ED.   I left voicemail for patient to call back and speak to the charge nurse.  Dayshift charge RN will also be notified that patient may be calling back and should return to the ED for further evaluation of positive blood cultures.

## 2023-02-17 NOTE — ED Provider Notes (Signed)
[facility] Provider Note    Event Date/Time   First MD Initiated Contact with Patient 02/17/23 1143     (approximate)   History   Chief Complaint Blood Infection   HPI  Heidi Hicks is a 62 y.o. female with past medical history who presents to the ED complaining of bacteremia.  Patient reports that she was seen in the ED yesterday for chills and bodyaches.  She states that the symptoms initially started 2 days ago with subjective fevers and chills with dysuria and generalized bodyaches.  When she was seen in the ED yesterday she had a fever and elevated heart rate, workup showed urinary tract infection but was otherwise reassuring with normal CT imaging of her abdomen/pelvis.  She was sent home on antibiotics, however blood cultures grew E. coli last night.  She received a call to come back to the hospital for reevaluation.  She states that she continues to have dysuria as well as some pain in the middle of her back.  She continues to have chills but did not have any fevers overnight, denies any abdominal pain, nausea, or vomiting.     Physical Exam   Triage Vital Signs: ED Triage Vitals [02/17/23 1136]  Enc Vitals Group     BP 121/75     Pulse Rate 91     Resp 20     Temp 98.3 F (36.8 C)     Temp Source Oral     SpO2 98 %     Weight      Height      Head Circumference      Peak Flow      Pain Score 0     Pain Loc      Pain Edu?      Excl. in GC?     Most recent vital signs: Vitals:   02/17/23 1136  BP: 121/75  Pulse: 91  Resp: 20  Temp: 98.3 F (36.8 C)  SpO2: 98%    Constitutional: Alert and oriented. Eyes: Conjunctivae are normal. Head: Atraumatic. Nose: No congestion/rhinnorhea. Mouth/Throat: Mucous membranes are moist.  Cardiovascular: Normal rate, regular rhythm. Grossly normal heart sounds.  2+ radial pulses bilaterally. Respiratory: Normal respiratory effort.  No retractions. Lungs CTAB. Gastrointestinal: Soft and  nontender. No distention. Musculoskeletal: No lower extremity tenderness nor edema.  Neurologic:  Normal speech and language. No gross focal neurologic deficits are appreciated.    ED Results / Procedures / Treatments   Labs (all labs ordered are listed, but only abnormal results are displayed) Labs Reviewed  CBC WITH DIFFERENTIAL/PLATELET - Abnormal; Notable for the following components:      Result Value   WBC 12.7 (*)    Neutro Abs 10.2 (*)    Monocytes Absolute 1.2 (*)    All other components within normal limits  COMPREHENSIVE METABOLIC PANEL - Abnormal; Notable for the following components:   Potassium 3.0 (*)    Glucose, Bld 122 (*)    Creatinine, Ser 1.06 (*)    Calcium 7.9 (*)    Albumin 3.2 (*)    All other components within normal limits  URINALYSIS, W/ REFLEX TO CULTURE (INFECTION SUSPECTED) - Abnormal; Notable for the following components:   Color, Urine YELLOW (*)    APPearance HAZY (*)    Hgb urine dipstick MODERATE (*)    Protein, ur 100 (*)    Leukocytes,Ua MODERATE (*)    Bacteria, UA RARE (*)    All other components  within normal limits  CULTURE, BLOOD (ROUTINE X 2)  CULTURE, BLOOD (ROUTINE X 2)  URINE CULTURE  LACTIC ACID, PLASMA  MAGNESIUM  PHOSPHORUS  PROCALCITONIN  HIV ANTIBODY (ROUTINE TESTING W REFLEX)    PROCEDURES:  Critical Care performed: No  Procedures   MEDICATIONS ORDERED IN ED: Medications  cefTRIAXone (ROCEPHIN) 2 g in sodium chloride 0.9 % 100 mL IVPB (has no administration in time range)  sodium chloride 0.9 % bolus 1,000 mL (has no administration in time range)  0.9 %  sodium chloride infusion (has no administration in time range)  ondansetron (ZOFRAN) injection 4 mg (has no administration in time range)  hydrALAZINE (APRESOLINE) injection 5 mg (has no administration in time range)  acetaminophen (TYLENOL) tablet 650 mg (has no administration in time range)  naproxen (NAPROSYN) tablet 500 mg (has no administration in time  range)  enoxaparin (LOVENOX) injection 60 mg (has no administration in time range)  lactated ringers bolus 1,000 mL (0 mLs Intravenous Paused 02/17/23 1252)  cefTRIAXone (ROCEPHIN) 2 g in sodium chloride 0.9 % 100 mL IVPB (0 g Intravenous Stopped 02/17/23 1326)  potassium chloride SA (KLOR-CON M) CR tablet 40 mEq (40 mEq Oral Given 02/17/23 1328)     IMPRESSION / MDM / ASSESSMENT AND PLAN / ED COURSE  I reviewed the triage vital signs and the nursing notes.                              62 y.o. female with past medical history of hypertension who presents to the ED complaining of chills, body aches, and dysuria for the past 2 days.  Patient's presentation is most consistent with acute presentation with potential threat to life or bodily function.  Differential diagnosis includes, but is not limited to, sepsis, bacteremia, cystitis, pyonephritis, kidney stone.  Patient well-appearing and in no acute distress, vital signs are unremarkable and she currently does not appear septic.  Urinalysis from visit last night was reviewed and consistent with UTI, does not appear that urine was sent for culture.  Blood cultures did grow E. coli and we will start on IV Rocephin for now, we will repeat blood cultures and urine culture.  CT imaging last night was negative for acute process, no evidence of urinary obstruction.  Lab results are pending at this time, will hydrate with IV fluids and reassess.  Urinalysis again appears concerning for UTI and we will send for culture.  Blood cultures repeated but vital signs remain reassuring and no signs concerning for sepsis.  She has a mild leukocytosis but lactic acid within normal limits, renal function improved from previous, she does have hypokalemia which we will replete.  LFTs and lipase are unremarkable.  Case discussed with hospitalist for admission.      FINAL CLINICAL IMPRESSION(S) / ED DIAGNOSES   Final diagnoses:  Acute cystitis without hematuria   Bacteremia     Rx / DC Orders   ED Discharge Orders     None        Note:  This document was prepared using Dragon voice recognition software and may include unintentional dictation errors.   Chesley Noon, MD 02/17/23 [id number]

## 2023-02-18 DIAGNOSIS — B9620 Unspecified Escherichia coli [E. coli] as the cause of diseases classified elsewhere: Secondary | ICD-10-CM

## 2023-02-18 DIAGNOSIS — R7881 Bacteremia: Secondary | ICD-10-CM

## 2023-02-18 LAB — CBC
HCT: 36.3 % (ref 36.0–46.0)
Hemoglobin: 11.4 g/dL — ABNORMAL LOW (ref 12.0–15.0)
MCH: 28.2 pg (ref 26.0–34.0)
MCHC: 31.4 g/dL (ref 30.0–36.0)
MCV: 89.9 fL (ref 80.0–100.0)
Platelets: 222 10*3/uL (ref 150–400)
RBC: 4.04 MIL/uL (ref 3.87–5.11)
RDW: 15.1 % (ref 11.5–15.5)
WBC: 6.9 10*3/uL (ref 4.0–10.5)
nRBC: 0 % (ref 0.0–0.2)

## 2023-02-18 LAB — BASIC METABOLIC PANEL
Anion gap: 5 (ref 5–15)
BUN: 13 mg/dL (ref 6–20)
CO2: 25 mmol/L (ref 22–32)
Calcium: 8 mg/dL — ABNORMAL LOW (ref 8.9–10.3)
Chloride: 112 mmol/L — ABNORMAL HIGH (ref 98–111)
Creatinine, Ser: 0.92 mg/dL (ref 0.44–1.00)
GFR, Estimated: >60 mL/min (ref >60)
Glucose, Bld: 121 mg/dL — ABNORMAL HIGH (ref 70–99)
Potassium: 3.8 mmol/L (ref 3.5–5.1)
Sodium: 142 mmol/L (ref 135–145)

## 2023-02-18 LAB — PHOSPHORUS: Phosphorus: 3.3 mg/dL (ref 2.5–4.6)

## 2023-02-18 LAB — URINE CULTURE

## 2023-02-18 LAB — CULTURE, BLOOD (ROUTINE X 2)

## 2023-02-18 LAB — HIV ANTIBODY (ROUTINE TESTING W REFLEX): HIV Screen 4th Generation wRfx: NONREACTIVE

## 2023-02-18 NOTE — Plan of Care (Signed)
  Problem: Education: Goal: Knowledge of General Education information will improve Description: Including pain rating scale, medication(s)/side effects and non-pharmacologic comfort measures Outcome: Progressing   Problem: Health Behavior/Discharge Planning: Goal: Ability to manage health-related needs will improve Outcome: Progressing   Problem: Clinical Measurements: Goal: Ability to maintain clinical measurements within normal limits will improve Outcome: Progressing Goal: Diagnostic test results will improve Outcome: Progressing   Problem: Activity: Goal: Risk for activity intolerance will decrease Outcome: Progressing   Problem: Nutrition: Goal: Adequate nutrition will be maintained Outcome: Progressing   Problem: Elimination: Goal: Will not experience complications related to bowel motility Outcome: Progressing Goal: Will not experience complications related to urinary retention Outcome: Progressing   Problem: Pain Managment: Goal: General experience of comfort will improve Outcome: Progressing   

## 2023-02-18 NOTE — Progress Notes (Signed)
PROGRESS NOTE    Heidi ADRIANCE  RUE:[id number] DOB: 15-May-1961 DOA: 02/17/2023 PCP: Glori Luis, MD   Assessment & Plan:   Principal Problem:   E coli bacteremia Active Problems:   UTI (urinary tract infection)   Sepsis (HCC)   Hypertension   Hypokalemia   Hypophosphatemia   Morbid obesity with BMI of 40.0-44.9, adult (HCC)  Assessment and Plan: E coli bacteremia: blood cx sens are pending. Likely secondary to UTI. Continue on IV rocephin.   UTI: urine cx is pending. Continue on IV rocephin  Sepsis: met criteria w/ leukocytosis, tachycardia, tachypnea & secondary to UTI & bacteremia. Continue on IV abxs & IVFs. Procal 28.5.   HTN: holding home dose of losartan-hydrochlorothiazide for concern for hypotension from sepsis.   Hypokalemia: WNL today   Hypophosphatemia: resolved    Morbid obesity: BMI 42.0. Complicates overall care & prognosis       DVT prophylaxis: lovenox  Code Status: full  Family Communication:  Disposition Plan: likely d/c back home   Level of care: Telemetry Medical Status is: Inpatient Remains inpatient appropriate because: severity of illness, requiring IV abxs      Consultants:    Procedures:   Antimicrobials: rocephin   Subjective: Pt c/o malaise   Objective: Vitals:   02/17/23 1136 02/17/23 1648 02/17/23 2245  BP: 121/75 115/78 124/68  Pulse: 91 81 83  Resp: 20 18 20   Temp: 98.3 F (36.8 C) 98 F (36.7 C) 97.9 F (36.6 C)  TempSrc: Oral Oral   SpO2: 98% 100% 98%    Intake/Output Summary (Last 24 hours) at 02/18/2023 1478 Last data filed at 02/18/2023 2956 Gross per 24 hour  Intake 2161.8 ml  Output --  Net 2161.8 ml   There were no vitals filed for this visit.  Examination:  General exam: Appears calm and comfortable  Respiratory system: Clear to auscultation. Respiratory effort normal. Cardiovascular system: S1 & S2 +. No rubs, gallops or clicks.  Gastrointestinal system: Abdomen is nondistended,  soft and nontender. Normal bowel sounds heard. Central nervous system: Alert and oriented. Moves all extremities  Psychiatry: Judgement and insight appear normal. Mood & affect appropriate.     Data Reviewed: I have personally reviewed following labs and imaging studies  CBC: Recent Labs  Lab 02/16/23 1735 02/17/23 1239 02/18/23 0513  WBC 16.7* 12.7* 6.9  NEUTROABS 15.3* 10.2*  --   HGB 13.4 12.1 11.4*  HCT 41.9 37.2 36.3  MCV 88.8 89.0 89.9  PLT 288 251 222   Basic Metabolic Panel: Recent Labs  Lab 02/16/23 1735 02/17/23 1237 02/17/23 1239 02/18/23 0513  NA 136  --  140 142  K 3.0*  --  3.0* 3.8  CL 104  --  110 112*  CO2 19*  --  23 25  GLUCOSE 102*  --  122* 121*  BUN 16  --  14 13  CREATININE 1.15*  --  1.06* 0.92  CALCIUM 8.7*  --  7.9* 8.0*  MG  --  2.0  --   --   PHOS  --  2.4*  --   --    GFR: Estimated Creatinine Clearance: 93.8 mL/min (by C-G formula based on SCr of 0.92 mg/dL). Liver Function Tests: Recent Labs  Lab 02/16/23 1735 02/17/23 1239  AST 31 37  ALT 20 26  ALKPHOS 99 97  BILITOT 0.9 0.8  PROT 7.6 6.6  ALBUMIN 4.1 3.2*   No results for input(s): "LIPASE", "AMYLASE" in the last 168 hours.  No results for input(s): "AMMONIA" in the last 168 hours. Coagulation Profile: No results for input(s): "INR", "PROTIME" in the last 168 hours. Cardiac Enzymes: No results for input(s): "CKTOTAL", "CKMB", "CKMBINDEX", "TROPONINI" in the last 168 hours. BNP (last 3 results) No results for input(s): "PROBNP" in the last 8760 hours. HbA1C: No results for input(s): "HGBA1C" in the last 72 hours. CBG: No results for input(s): "GLUCAP" in the last 168 hours. Lipid Profile: No results for input(s): "CHOL", "HDL", "LDLCALC", "TRIG", "CHOLHDL", "LDLDIRECT" in the last 72 hours. Thyroid Function Tests: No results for input(s): "TSH", "T4TOTAL", "FREET4", "T3FREE", "THYROIDAB" in the last 72 hours. Anemia Panel: No results for input(s): "VITAMINB12",  "FOLATE", "FERRITIN", "TIBC", "IRON", "RETICCTPCT" in the last 72 hours. Sepsis Labs: Recent Labs  Lab 02/16/23 1735 02/16/23 2030 02/17/23 1237 02/17/23 1239  PROCALCITON  --   --  28.56  --   LATICACIDVEN 3.0* 2.8*  --  1.2    Recent Results (from the past 240 hour(s))  Resp panel by RT-PCR (RSV, Flu A&B, Covid) Anterior Nasal Swab     Status: None   Collection Time: 02/16/23  4:22 PM   Specimen: Anterior Nasal Swab  Result Value Ref Range Status   SARS Coronavirus 2 by RT PCR NEGATIVE NEGATIVE Final    Comment: (NOTE) SARS-CoV-2 target nucleic acids are NOT DETECTED.  The SARS-CoV-2 RNA is generally detectable in upper respiratory specimens during the acute phase of infection. The lowest concentration of SARS-CoV-2 viral copies this assay can detect is 138 copies/mL. A negative result does not preclude SARS-Cov-2 infection and should not be used as the sole basis for treatment or other patient management decisions. A negative result may occur with  improper specimen collection/handling, submission of specimen other than nasopharyngeal swab, presence of viral mutation(s) within the areas targeted by this assay, and inadequate number of viral copies(<138 copies/mL). A negative result must be combined with clinical observations, patient history, and epidemiological information. The expected result is Negative.  Fact Sheet for Patients:  BloggerCourse.com  Fact Sheet for Healthcare Providers:  SeriousBroker.it  This test is no t yet approved or cleared by the [place] FDA and  has been authorized for detection and/or diagnosis of SARS-CoV-2 by FDA under an Emergency Use Authorization (EUA). This EUA will remain  in effect (meaning this test can be used) for the duration of the COVID-19 declaration under Section 564(b)(1) of the Act, 21 U.S.C.section 360bbb-3(b)(1), unless the authorization is terminated  or revoked  sooner.       Influenza A by PCR NEGATIVE NEGATIVE Final   Influenza B by PCR NEGATIVE NEGATIVE Final    Comment: (NOTE) The Xpert Xpress SARS-CoV-2/FLU/RSV plus assay is intended as an aid in the diagnosis of influenza from Nasopharyngeal swab specimens and should not be used as a sole basis for treatment. Nasal washings and aspirates are unacceptable for Xpert Xpress SARS-CoV-2/FLU/RSV testing.  Fact Sheet for Patients: BloggerCourse.com  Fact Sheet for Healthcare Providers: SeriousBroker.it  This test is not yet approved or cleared by the [place] FDA and has been authorized for detection and/or diagnosis of SARS-CoV-2 by FDA under an Emergency Use Authorization (EUA). This EUA will remain in effect (meaning this test can be used) for the duration of the COVID-19 declaration under Section 564(b)(1) of the Act, 21 U.S.C. section 360bbb-3(b)(1), unless the authorization is terminated or revoked.     Resp Syncytial Virus by PCR NEGATIVE NEGATIVE Final    Comment: (NOTE) Fact Sheet for Patients: BloggerCourse.com  Fact Sheet for Healthcare Providers: SeriousBroker.it  This test is not yet approved or cleared by the [place] FDA and has been authorized for detection and/or diagnosis of SARS-CoV-2 by FDA under an Emergency Use Authorization (EUA). This EUA will remain in effect (meaning this test can be used) for the duration of the COVID-19 declaration under Section 564(b)(1) of the Act, 21 U.S.C. section 360bbb-3(b)(1), unless the authorization is terminated or revoked.  Performed at [facility], [address] [place], [place] [address]   Blood Culture (routine x 2)     Status: None (Preliminary result)   Collection Time: 02/16/23  5:35 PM   Specimen: BLOOD RIGHT ARM  Result Value Ref Range Status   Specimen Description   Final    BLOOD RIGHT  ARM Performed at [facility], [address], [place], [place] [address]    Special Requests   Final    BOTTLES DRAWN AEROBIC AND ANAEROBIC Blood Culture adequate volume Performed at [facility], [address] [place], [place] [address]    Culture  Setup Time   Final    GRAM NEGATIVE RODS ANAEROBIC BOTTLE ONLY CRITICAL RESULT CALLED TO, READ BACK BY AND VERIFIED WITH: ASHLEY ORSUTO AT 0435 02/17/23.PMF Performed at [facility], [address] [place], [place] [address]    Culture GRAM NEGATIVE RODS  Final   Report Status PENDING  Incomplete  Blood Culture (routine x 2)     Status: None (Preliminary result)   Collection Time: 02/16/23  5:35 PM   Specimen: BLOOD LEFT ARM  Result Value Ref Range Status   Specimen Description BLOOD LEFT ARM  Final   Special Requests   Final    BOTTLES DRAWN AEROBIC AND ANAEROBIC Blood Culture adequate volume   Culture   Final    NO GROWTH 2 DAYS Performed at [facility], [address] [place], [place] [address]    Report Status PENDING  Incomplete  Blood Culture ID Panel (Reflexed)     Status: Abnormal   Collection Time: 02/16/23  5:35 PM  Result Value Ref Range Status   Enterococcus faecalis NOT DETECTED NOT DETECTED Final   Enterococcus Faecium NOT DETECTED NOT DETECTED Final   Listeria monocytogenes NOT DETECTED NOT DETECTED Final   Staphylococcus species NOT DETECTED NOT DETECTED Final   Staphylococcus aureus (BCID) NOT DETECTED NOT DETECTED Final   Staphylococcus epidermidis NOT DETECTED NOT DETECTED Final   Staphylococcus lugdunensis NOT DETECTED NOT DETECTED Final   Streptococcus species NOT DETECTED NOT DETECTED Final   Streptococcus agalactiae NOT DETECTED NOT DETECTED Final   Streptococcus pneumoniae NOT DETECTED NOT DETECTED Final   Streptococcus pyogenes NOT DETECTED NOT DETECTED Final   A.calcoaceticus-baumannii NOT DETECTED NOT DETECTED Final   Bacteroides fragilis NOT DETECTED NOT  DETECTED Final   Enterobacterales DETECTED (A) NOT DETECTED Final    Comment: Enterobacterales represent a large order of gram negative bacteria, not a single organism. CRITICAL RESULT CALLED TO, READ BACK BY AND VERIFIED WITH: ASHLEY ORSUTO AT 0435 02/17/23.PMF    Enterobacter cloacae complex NOT DETECTED NOT DETECTED Final   Escherichia coli DETECTED (A) NOT DETECTED Final    Comment: CRITICAL RESULT CALLED TO, READ BACK BY AND VERIFIED WITH: ASHLEY ORSUTO AT 0435 02/17/23.PMF    Klebsiella aerogenes NOT DETECTED NOT DETECTED Final   Klebsiella oxytoca NOT DETECTED NOT DETECTED Final   Klebsiella pneumoniae NOT DETECTED NOT DETECTED Final   Proteus species NOT DETECTED NOT DETECTED Final  Salmonella species NOT DETECTED NOT DETECTED Final   Serratia marcescens NOT DETECTED NOT DETECTED Final   Haemophilus influenzae NOT DETECTED NOT DETECTED Final   Neisseria meningitidis NOT DETECTED NOT DETECTED Final   Pseudomonas aeruginosa NOT DETECTED NOT DETECTED Final   Stenotrophomonas maltophilia NOT DETECTED NOT DETECTED Final   Candida albicans NOT DETECTED NOT DETECTED Final   Candida auris NOT DETECTED NOT DETECTED Final   Candida glabrata NOT DETECTED NOT DETECTED Final   Candida krusei NOT DETECTED NOT DETECTED Final   Candida parapsilosis NOT DETECTED NOT DETECTED Final   Candida tropicalis NOT DETECTED NOT DETECTED Final   Cryptococcus neoformans/gattii NOT DETECTED NOT DETECTED Final   CTX-M ESBL NOT DETECTED NOT DETECTED Final   Carbapenem resistance IMP NOT DETECTED NOT DETECTED Final   Carbapenem resistance KPC NOT DETECTED NOT DETECTED Final   Carbapenem resistance NDM NOT DETECTED NOT DETECTED Final   Carbapenem resist OXA 48 LIKE NOT DETECTED NOT DETECTED Final   Carbapenem resistance VIM NOT DETECTED NOT DETECTED Final    Comment: Performed at [facility], [address] [place], [place] [address]  Blood culture (routine x 2)     Status: None (Preliminary  result)   Collection Time: 02/17/23 12:39 PM   Specimen: BLOOD  Result Value Ref Range Status   Specimen Description BLOOD BLOOD RIGHT ARM  Final   Special Requests   Final    BOTTLES DRAWN AEROBIC AND ANAEROBIC Blood Culture results may not be optimal due to an excessive volume of blood received in culture bottles   Culture   Final    NO GROWTH < 24 HOURS Performed at [facility], [address] [place], [place] [address]    Report Status PENDING  Incomplete  Urine Culture     Status: None (Preliminary result)   Collection Time: 02/17/23 12:39 PM   Specimen: Urine, Random  Result Value Ref Range Status   Specimen Description   Final    URINE, RANDOM Performed at [facility], [address] [place], [place] [address]    Special Requests   Final    URINE, CLEAN CATCH Performed at [facility], [address], [place], [place] [address]    Culture PENDING  Incomplete   Report Status PENDING  Incomplete  Blood culture (routine x 2)     Status: None (Preliminary result)   Collection Time: 02/17/23 12:48 PM   Specimen: BLOOD  Result Value Ref Range Status   Specimen Description BLOOD BLOOD RIGHT ARM  Final   Special Requests   Final    BOTTLES DRAWN AEROBIC AND ANAEROBIC Blood Culture results may not be optimal due to an excessive volume of blood received in culture bottles   Culture   Final    NO GROWTH < 24 HOURS Performed at [facility], [address], [place], [place] [address]    Report Status PENDING  Incomplete         Radiology Studies: CT ABDOMEN PELVIS W CONTRAST  Result Date: 02/16/2023 CLINICAL DATA:  Right lower quadrant abdominal pain.  Body aches. EXAM: CT ABDOMEN AND PELVIS WITH CONTRAST TECHNIQUE: Multidetector CT imaging of the abdomen and pelvis was performed using the standard protocol following bolus administration of intravenous contrast. RADIATION DOSE REDUCTION: This exam was performed according to the  departmental dose-optimization program which includes automated exposure control, adjustment of the mA and/or kV according to patient size and/or use of iterative reconstruction technique. CONTRAST:  OMNIPAQUE  IOHEXOL 350 MG/ML SOLN COMPARISON:  CT abdomen pelvis dated August 08, 2020. FINDINGS: Lower chest: No acute abnormality. Left-greater-than-right basilar scarring/atelectasis. Hepatobiliary: No focal liver abnormality is seen. Status post cholecystectomy. No biliary dilatation. Pancreas: Unremarkable. No pancreatic ductal dilatation or surrounding inflammatory changes. Spleen: Normal in size without focal abnormality. Adrenals/Urinary Tract: Adrenal glands are unremarkable. Kidneys are normal, without renal calculi, solid lesion, or hydronephrosis. Small simple cyst in the upper pole of the right kidney has slightly increased in size since the prior study. No follow-up imaging is recommended. Bladder is unremarkable. Stomach/Bowel: Stomach is within normal limits. Appendix appears normal. No evidence of bowel wall thickening, distention, or inflammatory changes. Vascular/Lymphatic: No significant vascular findings are present. No enlarged abdominal or pelvic lymph nodes. Reproductive: Uterus and bilateral adnexa are unremarkable. Other: Small fat containing umbilical hernia. No free fluid or pneumoperitoneum. Musculoskeletal: No acute or significant osseous findings. IMPRESSION: 1. No acute intra-abdominal process. Normal appendix. Electronically Signed   By: Obie Dredge M.D.   On: 02/16/2023 21:33   DG Chest Port 1 View  Result Date: 02/16/2023 CLINICAL DATA:  Questionable sepsis. EXAM: PORTABLE CHEST 1 VIEW COMPARISON:  None Available. FINDINGS: No focal consolidation, pleural effusion, or pneumothorax. The cardiac silhouette is within normal limits. No acute osseous pathology. IMPRESSION: No active disease. Electronically Signed   By: Elgie Collard M.D.   On: 02/16/2023 17:47         Scheduled Meds:  enoxaparin (LOVENOX) injection  60 mg Subcutaneous Q24H   naproxen  500 mg Oral BID WC   Continuous Infusions:  sodium chloride 75 mL/hr at 02/17/23 1654   cefTRIAXone (ROCEPHIN)  IV       LOS: 1 day    Time spent: 35 mins     Charise Killian, MD [facility] Pager [phone]  If 7PM-7AM, please contact night-coverage [email or web address] 02/18/2023, 8:08 AM

## 2023-02-19 DIAGNOSIS — B9620 Unspecified Escherichia coli [E. coli] as the cause of diseases classified elsewhere: Secondary | ICD-10-CM | POA: Diagnosis not present

## 2023-02-19 DIAGNOSIS — R7881 Bacteremia: Secondary | ICD-10-CM | POA: Diagnosis not present

## 2023-02-19 LAB — BASIC METABOLIC PANEL
Anion gap: 6 (ref 5–15)
BUN: 14 mg/dL (ref 6–20)
CO2: 24 mmol/L (ref 22–32)
Calcium: 8.4 mg/dL — ABNORMAL LOW (ref 8.9–10.3)
Chloride: 109 mmol/L (ref 98–111)
Creatinine, Ser: 0.92 mg/dL (ref 0.44–1.00)
GFR, Estimated: >60 mL/min (ref >60)
Glucose, Bld: 99 mg/dL (ref 70–99)
Potassium: 3.9 mmol/L (ref 3.5–5.1)
Sodium: 139 mmol/L (ref 135–145)

## 2023-02-19 LAB — CBC
HCT: 37.2 % (ref 36.0–46.0)
Hemoglobin: 11.8 g/dL — ABNORMAL LOW (ref 12.0–15.0)
MCH: 28.4 pg (ref 26.0–34.0)
MCHC: 31.7 g/dL (ref 30.0–36.0)
MCV: 89.6 fL (ref 80.0–100.0)
Platelets: 242 10*3/uL (ref 150–400)
RBC: 4.15 MIL/uL (ref 3.87–5.11)
RDW: 14.8 % (ref 11.5–15.5)
WBC: 5.6 10*3/uL (ref 4.0–10.5)
nRBC: 0 % (ref 0.0–0.2)

## 2023-02-19 LAB — CULTURE, BLOOD (ROUTINE X 2)
Culture: NO GROWTH
Special Requests: ADEQUATE

## 2023-02-19 MED ORDER — CIPROFLOXACIN HCL 500 MG PO TABS: 500.0000 mg | ORAL_TABLET | Freq: Two times a day (BID) | ORAL | 0 refills | Status: AC

## 2023-02-19 NOTE — Plan of Care (Signed)
  Problem: Education: Goal: Knowledge of General Education information will improve Description: Including pain rating scale, medication(s)/side effects and non-pharmacologic comfort measures Outcome: Progressing   Problem: Health Behavior/Discharge Planning: Goal: Ability to manage health-related needs will improve Outcome: Progressing   Problem: Clinical Measurements: Goal: Ability to maintain clinical measurements within normal limits will improve Outcome: Progressing Goal: Diagnostic test results will improve Outcome: Progressing   Problem: Nutrition: Goal: Adequate nutrition will be maintained Outcome: Progressing   Problem: Elimination: Goal: Will not experience complications related to bowel motility Outcome: Progressing Goal: Will not experience complications related to urinary retention Outcome: Progressing   Problem: Pain Managment: Goal: General experience of comfort will improve Outcome: Progressing   

## 2023-02-19 NOTE — Plan of Care (Signed)

## 2023-02-19 NOTE — Discharge Summary (Signed)
Physician Discharge Summary  Heidi Hicks ZOX:[id number] DOB: 10-Jun-1961 DOA: 02/17/2023  PCP: Glori Luis, MD  Admit date: 02/17/2023 Discharge date: 02/19/2023  Admitted From: home  Disposition:  home   Recommendations for Outpatient Follow-up:  Follow up with PCP in 1-2 weeks   Home Health: no Equipment/Devices:  Discharge Condition: stable  CODE STATUS: full  Diet recommendation: Heart Healthy   Brief/Interim Summary: HPI was taken from Dr. Clyde Lundborg: Heidi Hicks is a 62 y.o. female with medical history significant of hypertension, obesity, psoriasis, arthritis, who presents with bacteremia.   Patient was seen in the ED yesterday due to UTI, patient was started on Keflex.  CT of abdomen/pelvis was negative.  Today patient is called back since patient's blood culture is positive for E. Coli.  Patient states that she still has increased urinary frequency, but her previous dysuria and burning or urination have improved now.  She has fever, no chills.  Her temperature is 100.4 in ED.  Denies nausea vomiting, diarrhea or abdominal pain.  No chest pain, cough, shortness of breath.   Data reviewed independently and ED Course: pt was found to have WBC 12.7, lactic acid 1.2 (2.8 yesterday), GFR> 60, potassium 3.0, magnesium 2.0, phosphorus 2.4.  Positive urinalysis (hazy appearance, moderate amount leukocyte, rare bacteria, WBC> 50).  Temperature 100.4, blood pressure 121/75, heart rate 129, RR 33, oxygen saturation 98% on room air.  Patient is admitted to telemetry bed as inpatient.      Discharge Diagnoses:  Principal Problem:   E coli bacteremia Active Problems:   UTI (urinary tract infection)   Sepsis (HCC)   Hypertension   Hypokalemia   Hypophosphatemia   Morbid obesity with BMI of 40.0-44.9, adult (HCC)  E coli bacteremia: blood cx growing e.coli. Likely secondary to UTI. Transition to po cipro at d/c. Repeat blood cxs NGTD  UTI: urine cx shows no growth. Likely  started abxs prior to obtaining the urine cx. Continue on IV rocephin and transition to po cipro at d/c   Sepsis: met criteria w/ leukocytosis, tachycardia, tachypnea & secondary to UTI & bacteremia. Continue on IV abxs & IVFs. Procal 28.5. Sepsis resolved    HTN:  restarting home HCTZ.   Hypokalemia: WNL today   Hypophosphatemia: resolved    Morbid obesity: BMI 42.0. Complicates overall care & prognosis  Discharge Instructions  Discharge Instructions     Diet - low sodium heart healthy   Complete by: As directed    Discharge instructions   Complete by: As directed    F/u w/ PCP in 1-2 weeks. Take either meloxicam or naproxen but NOT both. Both meloxicam & naproxen are NSAIDs/Nonsteroidal antiinflammatory drugs   Increase activity slowly   Complete by: As directed       Allergies as of 02/19/2023       Reactions   Ace Inhibitors Cough   No cough or complaints with Losartan.         Medication List     STOP taking these medications    cephALEXin 500 MG capsule Commonly known as: KEFLEX   meloxicam 7.5 MG tablet Commonly known as: MOBIC       TAKE these medications    ciprofloxacin 500 MG tablet Commonly known as: Cipro Take 1 tablet (500 mg total) by mouth 2 (two) times daily for 8 days.   losartan-hydrochlorothiazide 50-12.5 MG tablet Commonly known as: HYZAAR TAKE 1 TABLET BY MOUTH DAILY   naproxen 500 MG tablet Commonly known as: Naprosyn  Take 1 tablet (500 mg total) by mouth 2 (two) times daily with a meal.   ondansetron 4 MG disintegrating tablet Commonly known as: ZOFRAN-ODT Take 1 tablet (4 mg total) by mouth every 8 (eight) hours as needed for nausea or vomiting.        Allergies  Allergen Reactions   Ace Inhibitors Cough    No cough or complaints with Losartan.     Consultations:    Procedures/Studies: CT ABDOMEN PELVIS W CONTRAST  Result Date: 02/16/2023 CLINICAL DATA:  Right lower quadrant abdominal pain.  Body aches. EXAM:  CT ABDOMEN AND PELVIS WITH CONTRAST TECHNIQUE: Multidetector CT imaging of the abdomen and pelvis was performed using the standard protocol following bolus administration of intravenous contrast. RADIATION DOSE REDUCTION: This exam was performed according to the departmental dose-optimization program which includes automated exposure control, adjustment of the mA and/or kV according to patient size and/or use of iterative reconstruction technique. CONTRAST:  OMNIPAQUE IOHEXOL 350 MG/ML SOLN COMPARISON:  CT abdomen pelvis dated August 08, 2020. FINDINGS: Lower chest: No acute abnormality. Left-greater-than-right basilar scarring/atelectasis. Hepatobiliary: No focal liver abnormality is seen. Status post cholecystectomy. No biliary dilatation. Pancreas: Unremarkable. No pancreatic ductal dilatation or surrounding inflammatory changes. Spleen: Normal in size without focal abnormality. Adrenals/Urinary Tract: Adrenal glands are unremarkable. Kidneys are normal, without renal calculi, solid lesion, or hydronephrosis. Small simple cyst in the upper pole of the right kidney has slightly increased in size since the prior study. No follow-up imaging is recommended. Bladder is unremarkable. Stomach/Bowel: Stomach is within normal limits. Appendix appears normal. No evidence of bowel wall thickening, distention, or inflammatory changes. Vascular/Lymphatic: No significant vascular findings are present. No enlarged abdominal or pelvic lymph nodes. Reproductive: Uterus and bilateral adnexa are unremarkable. Other: Small fat containing umbilical hernia. No free fluid or pneumoperitoneum. Musculoskeletal: No acute or significant osseous findings. IMPRESSION: 1. No acute intra-abdominal process. Normal appendix. Electronically Signed   By: Obie Dredge M.D.   On: 02/16/2023 21:33   DG Chest Port 1 View  Result Date: 02/16/2023 CLINICAL DATA:  Questionable sepsis. EXAM: PORTABLE CHEST 1 VIEW COMPARISON:  None  Available. FINDINGS: No focal consolidation, pleural effusion, or pneumothorax. The cardiac silhouette is within normal limits. No acute osseous pathology. IMPRESSION: No active disease. Electronically Signed   By: Elgie Collard M.D.   On: 02/16/2023 17:47   (Echo, Carotid, EGD, Colonoscopy, ERCP)    Subjective: Pt denies any complaints    Discharge Exam: Vitals:   02/18/23 1614 02/18/23 2326  BP: 127/71 133/77  Pulse: 79 75  Resp: 16 20  Temp: 97.6 F (36.4 C) 97.6 F (36.4 C)  SpO2: 100% 100%   Vitals:   02/18/23 0829 02/18/23 1358 02/18/23 1614 02/18/23 2326  BP: 138/84 138/84 127/71 133/77  Pulse: 70 70 79 75  Resp: 18 18 16 20   Temp: 97.7 F (36.5 C) 97.7 F (36.5 C) 97.6 F (36.4 C) 97.6 F (36.4 C)  TempSrc:  Oral    SpO2: 100%  100% 100%  Weight:  129.3 kg    Height:  5' 9.02" (1.753 m)      General: Pt is alert, awake, not in acute distress Cardiovascular:  S1/S2 +, no rubs, no gallops Respiratory: CTA bilaterally, no wheezing, no rhonchi Abdominal: Soft, NT, obese, bowel sounds + Extremities:  no cyanosis    The results of significant diagnostics from this hospitalization (including imaging, microbiology, ancillary and laboratory) are listed below for reference.     Microbiology: Recent  Results (from the past 240 hour(s))  Resp panel by RT-PCR (RSV, Flu A&B, Covid) Anterior Nasal Swab     Status: None   Collection Time: 02/16/23  4:22 PM   Specimen: Anterior Nasal Swab  Result Value Ref Range Status   SARS Coronavirus 2 by RT PCR NEGATIVE NEGATIVE Final    Comment: (NOTE) SARS-CoV-2 target nucleic acids are NOT DETECTED.  The SARS-CoV-2 RNA is generally detectable in upper respiratory specimens during the acute phase of infection. The lowest concentration of SARS-CoV-2 viral copies this assay can detect is 138 copies/mL. A negative result does not preclude SARS-Cov-2 infection and should not be used as the sole basis for treatment or other  patient management decisions. A negative result may occur with  improper specimen collection/handling, submission of specimen other than nasopharyngeal swab, presence of viral mutation(s) within the areas targeted by this assay, and inadequate number of viral copies(<138 copies/mL). A negative result must be combined with clinical observations, patient history, and epidemiological information. The expected result is Negative.  Fact Sheet for Patients:  BloggerCourse.com  Fact Sheet for Healthcare Providers:  SeriousBroker.it  This test is no t yet approved or cleared by the [place] FDA and  has been authorized for detection and/or diagnosis of SARS-CoV-2 by FDA under an Emergency Use Authorization (EUA). This EUA will remain  in effect (meaning this test can be used) for the duration of the COVID-19 declaration under Section 564(b)(1) of the Act, 21 U.S.C.section 360bbb-3(b)(1), unless the authorization is terminated  or revoked sooner.       Influenza A by PCR NEGATIVE NEGATIVE Final   Influenza B by PCR NEGATIVE NEGATIVE Final    Comment: (NOTE) The Xpert Xpress SARS-CoV-2/FLU/RSV plus assay is intended as an aid in the diagnosis of influenza from Nasopharyngeal swab specimens and should not be used as a sole basis for treatment. Nasal washings and aspirates are unacceptable for Xpert Xpress SARS-CoV-2/FLU/RSV testing.  Fact Sheet for Patients: BloggerCourse.com  Fact Sheet for Healthcare Providers: SeriousBroker.it  This test is not yet approved or cleared by the [place] FDA and has been authorized for detection and/or diagnosis of SARS-CoV-2 by FDA under an Emergency Use Authorization (EUA). This EUA will remain in effect (meaning this test can be used) for the duration of the COVID-19 declaration under Section 564(b)(1) of the Act, 21 U.S.C. section  360bbb-3(b)(1), unless the authorization is terminated or revoked.     Resp Syncytial Virus by PCR NEGATIVE NEGATIVE Final    Comment: (NOTE) Fact Sheet for Patients: BloggerCourse.com  Fact Sheet for Healthcare Providers: SeriousBroker.it  This test is not yet approved or cleared by the [place] FDA and has been authorized for detection and/or diagnosis of SARS-CoV-2 by FDA under an Emergency Use Authorization (EUA). This EUA will remain in effect (meaning this test can be used) for the duration of the COVID-19 declaration under Section 564(b)(1) of the Act, 21 U.S.C. section 360bbb-3(b)(1), unless the authorization is terminated or revoked.  Performed at [facility], [address] [place], [place] [address]   Blood Culture (routine x 2)     Status: Abnormal (Preliminary result)   Collection Time: 02/16/23  5:35 PM   Specimen: BLOOD RIGHT ARM  Result Value Ref Range Status   Specimen Description   Final    BLOOD RIGHT ARM Performed at [facility], [address] [place], [place] [address]    Special Requests   Final    BOTTLES DRAWN AEROBIC AND  ANAEROBIC Blood Culture adequate volume Performed at [facility], [address] [place], [place] [address]    Culture  Setup Time   Final    GRAM NEGATIVE RODS ANAEROBIC BOTTLE ONLY CRITICAL RESULT CALLED TO, READ BACK BY AND VERIFIED WITH: ASHLEY ORSUTO AT 1027 02/17/23.PMF Performed at [facility], [address], [place], [place] [address]    Culture ESCHERICHIA COLI (A)  Final   Report Status PENDING  Incomplete   Organism ID, Bacteria ESCHERICHIA COLI  Final      Susceptibility   Escherichia coli - MIC*    AMPICILLIN >=32 RESISTANT Resistant     CEFEPIME <=0.12 SENSITIVE Sensitive     CEFTAZIDIME <=1 SENSITIVE Sensitive     CEFTRIAXONE <=0.25 SENSITIVE Sensitive     CIPROFLOXACIN <=0.25 SENSITIVE Sensitive     GENTAMICIN  <=1 SENSITIVE Sensitive     IMIPENEM <=0.25 SENSITIVE Sensitive     TRIMETH/SULFA <=20 SENSITIVE Sensitive     AMPICILLIN/SULBACTAM >=32 RESISTANT Resistant     PIP/TAZO <=4 SENSITIVE Sensitive     * ESCHERICHIA COLI  Blood Culture (routine x 2)     Status: None (Preliminary result)   Collection Time: 02/16/23  5:35 PM   Specimen: BLOOD LEFT ARM  Result Value Ref Range Status   Specimen Description BLOOD LEFT ARM  Final   Special Requests   Final    BOTTLES DRAWN AEROBIC AND ANAEROBIC Blood Culture adequate volume   Culture   Final    NO GROWTH 3 DAYS Performed at [facility], [address] [place], [place] [address]    Report Status PENDING  Incomplete  Blood Culture ID Panel (Reflexed)     Status: Abnormal   Collection Time: 02/16/23  5:35 PM  Result Value Ref Range Status   Enterococcus faecalis NOT DETECTED NOT DETECTED Final   Enterococcus Faecium NOT DETECTED NOT DETECTED Final   Listeria monocytogenes NOT DETECTED NOT DETECTED Final   Staphylococcus species NOT DETECTED NOT DETECTED Final   Staphylococcus aureus (BCID) NOT DETECTED NOT DETECTED Final   Staphylococcus epidermidis NOT DETECTED NOT DETECTED Final   Staphylococcus lugdunensis NOT DETECTED NOT DETECTED Final   Streptococcus species NOT DETECTED NOT DETECTED Final   Streptococcus agalactiae NOT DETECTED NOT DETECTED Final   Streptococcus pneumoniae NOT DETECTED NOT DETECTED Final   Streptococcus pyogenes NOT DETECTED NOT DETECTED Final   A.calcoaceticus-baumannii NOT DETECTED NOT DETECTED Final   Bacteroides fragilis NOT DETECTED NOT DETECTED Final   Enterobacterales DETECTED (A) NOT DETECTED Final    Comment: Enterobacterales represent a large order of gram negative bacteria, not a single organism. CRITICAL RESULT CALLED TO, READ BACK BY AND VERIFIED WITH: ASHLEY ORSUTO AT 0435 02/17/23.PMF    Enterobacter cloacae complex NOT DETECTED NOT DETECTED Final   Escherichia coli DETECTED (A) NOT  DETECTED Final    Comment: CRITICAL RESULT CALLED TO, READ BACK BY AND VERIFIED WITH: ASHLEY ORSUTO AT 0435 02/17/23.PMF    Klebsiella aerogenes NOT DETECTED NOT DETECTED Final   Klebsiella oxytoca NOT DETECTED NOT DETECTED Final   Klebsiella pneumoniae NOT DETECTED NOT DETECTED Final   Proteus species NOT DETECTED NOT DETECTED Final   Salmonella species NOT DETECTED NOT DETECTED Final   Serratia marcescens NOT DETECTED NOT DETECTED Final   Haemophilus influenzae NOT DETECTED NOT DETECTED Final   Neisseria meningitidis NOT DETECTED NOT DETECTED Final   Pseudomonas aeruginosa NOT DETECTED NOT DETECTED Final   Stenotrophomonas maltophilia NOT DETECTED NOT DETECTED Final   Candida  albicans NOT DETECTED NOT DETECTED Final   Candida auris NOT DETECTED NOT DETECTED Final   Candida glabrata NOT DETECTED NOT DETECTED Final   Candida krusei NOT DETECTED NOT DETECTED Final   Candida parapsilosis NOT DETECTED NOT DETECTED Final   Candida tropicalis NOT DETECTED NOT DETECTED Final   Cryptococcus neoformans/gattii NOT DETECTED NOT DETECTED Final   CTX-M ESBL NOT DETECTED NOT DETECTED Final   Carbapenem resistance IMP NOT DETECTED NOT DETECTED Final   Carbapenem resistance KPC NOT DETECTED NOT DETECTED Final   Carbapenem resistance NDM NOT DETECTED NOT DETECTED Final   Carbapenem resist OXA 48 LIKE NOT DETECTED NOT DETECTED Final   Carbapenem resistance VIM NOT DETECTED NOT DETECTED Final    Comment: Performed at [facility], [address] [place], [place] [address]  Blood culture (routine x 2)     Status: None (Preliminary result)   Collection Time: 02/17/23 12:39 PM   Specimen: BLOOD  Result Value Ref Range Status   Specimen Description BLOOD BLOOD RIGHT ARM  Final   Special Requests   Final    BOTTLES DRAWN AEROBIC AND ANAEROBIC Blood Culture results may not be optimal due to an excessive volume of blood received in culture bottles   Culture   Final    NO GROWTH 2 DAYS Performed  at [facility], [address], [place], [place] [address]    Report Status PENDING  Incomplete  Urine Culture     Status: None   Collection Time: 02/17/23 12:39 PM   Specimen: Urine, Random  Result Value Ref Range Status   Specimen Description   Final    URINE, RANDOM Performed at [facility], [address] [place], [place] [address]    Special Requests URINE, CLEAN CATCH  Final   Culture   Final    NO GROWTH Performed at [facility], [address], [place], [place] [address]    Report Status 02/18/2023 FINAL  Final  Blood culture (routine x 2)     Status: None (Preliminary result)   Collection Time: 02/17/23 12:48 PM   Specimen: BLOOD  Result Value Ref Range Status   Specimen Description BLOOD BLOOD RIGHT ARM  Final   Special Requests   Final    BOTTLES DRAWN AEROBIC AND ANAEROBIC Blood Culture results may not be optimal due to an excessive volume of blood received in culture bottles   Culture   Final    NO GROWTH 2 DAYS Performed at [facility], [address], [place], [place] [address]    Report Status PENDING  Incomplete     Labs: BNP (last 3 results) No results for input(s): "BNP" in the last 8760 hours. Basic Metabolic Panel: Recent Labs  Lab 02/16/23 1735 02/17/23 1237 02/17/23 1239 02/18/23 0513 02/19/23 0452  NA 136  --  140 142 139  K 3.0*  --  3.0* 3.8 3.9  CL 104  --  110 112* 109  CO2 19*  --  23 25 24   GLUCOSE 102*  --  122* 121* 99  BUN 16  --  14 13 14   CREATININE 1.15*  --  1.06* 0.92 0.92  CALCIUM 8.7*  --  7.9* 8.0* 8.4*  MG  --  2.0  --   --   --   PHOS  --  2.4*  --  3.3  --    Liver Function Tests: Recent Labs  Lab 02/16/23 1735 02/17/23 1239  AST 31 37  ALT 20 26  ALKPHOS 99 97  BILITOT 0.9 0.8  PROT 7.6 6.6  ALBUMIN 4.1 3.2*   No results for input(s): "LIPASE", "AMYLASE" in the last 168 hours. No results for input(s): "AMMONIA" in the last 168 hours. CBC: Recent Labs  Lab  02/16/23 1735 02/17/23 1239 02/18/23 0513 02/19/23 0452  WBC 16.7* 12.7* 6.9 5.6  NEUTROABS 15.3* 10.2*  --   --   HGB 13.4 12.1 11.4* 11.8*  HCT 41.9 37.2 36.3 37.2  MCV 88.8 89.0 89.9 89.6  PLT 288 251 222 242   Cardiac Enzymes: No results for input(s): "CKTOTAL", "CKMB", "CKMBINDEX", "TROPONINI" in the last 168 hours. BNP: Invalid input(s): "POCBNP" CBG: No results for input(s): "GLUCAP" in the last 168 hours. D-Dimer No results for input(s): "DDIMER" in the last 72 hours. Hgb A1c No results for input(s): "HGBA1C" in the last 72 hours. Lipid Profile No results for input(s): "CHOL", "HDL", "LDLCALC", "TRIG", "CHOLHDL", "LDLDIRECT" in the last 72 hours. Thyroid function studies No results for input(s): "TSH", "T4TOTAL", "T3FREE", "THYROIDAB" in the last 72 hours.  Invalid input(s): "FREET3" Anemia work up No results for input(s): "VITAMINB12", "FOLATE", "FERRITIN", "TIBC", "IRON", "RETICCTPCT" in the last 72 hours. Urinalysis    Component Value Date/Time   COLORURINE YELLOW (A) 02/17/2023 1239   APPEARANCEUR HAZY (A) 02/17/2023 1239   LABSPEC 1.028 02/17/2023 1239   PHURINE 6.0 02/17/2023 1239   GLUCOSEU NEGATIVE 02/17/2023 1239   HGBUR MODERATE (A) 02/17/2023 1239   BILIRUBINUR NEGATIVE 02/17/2023 1239   KETONESUR NEGATIVE 02/17/2023 1239   PROTEINUR 100 (A) 02/17/2023 1239   NITRITE NEGATIVE 02/17/2023 1239   LEUKOCYTESUR MODERATE (A) 02/17/2023 1239   Sepsis Labs Recent Labs  Lab 02/16/23 1735 02/17/23 1239 02/18/23 0513 02/19/23 0452  WBC 16.7* 12.7* 6.9 5.6   Microbiology Recent Results (from the past 240 hour(s))  Resp panel by RT-PCR (RSV, Flu A&B, Covid) Anterior Nasal Swab     Status: None   Collection Time: 02/16/23  4:22 PM   Specimen: Anterior Nasal Swab  Result Value Ref Range Status   SARS Coronavirus 2 by RT PCR NEGATIVE NEGATIVE Final    Comment: (NOTE) SARS-CoV-2 target nucleic acids are NOT DETECTED.  The SARS-CoV-2 RNA is generally  detectable in upper respiratory specimens during the acute phase of infection. The lowest concentration of SARS-CoV-2 viral copies this assay can detect is 138 copies/mL. A negative result does not preclude SARS-Cov-2 infection and should not be used as the sole basis for treatment or other patient management decisions. A negative result may occur with  improper specimen collection/handling, submission of specimen other than nasopharyngeal swab, presence of viral mutation(s) within the areas targeted by this assay, and inadequate number of viral copies(<138 copies/mL). A negative result must be combined with clinical observations, patient history, and epidemiological information. The expected result is Negative.  Fact Sheet for Patients:  BloggerCourse.com  Fact Sheet for Healthcare Providers:  SeriousBroker.it  This test is no t yet approved or cleared by the [place] FDA and  has been authorized for detection and/or diagnosis of SARS-CoV-2 by FDA under an Emergency Use Authorization (EUA). This EUA will remain  in effect (meaning this test can be used) for the duration of the COVID-19 declaration under Section 564(b)(1) of the Act, 21 U.S.C.section 360bbb-3(b)(1), unless the authorization is terminated  or revoked sooner.       Influenza A by PCR NEGATIVE NEGATIVE Final   Influenza B by PCR NEGATIVE NEGATIVE Final    Comment: (  NOTE) The Xpert Xpress SARS-CoV-2/FLU/RSV plus assay is intended as an aid in the diagnosis of influenza from Nasopharyngeal swab specimens and should not be used as a sole basis for treatment. Nasal washings and aspirates are unacceptable for Xpert Xpress SARS-CoV-2/FLU/RSV testing.  Fact Sheet for Patients: BloggerCourse.com  Fact Sheet for Healthcare Providers: SeriousBroker.it  This test is not yet approved or cleared by the [place] FDA  and has been authorized for detection and/or diagnosis of SARS-CoV-2 by FDA under an Emergency Use Authorization (EUA). This EUA will remain in effect (meaning this test can be used) for the duration of the COVID-19 declaration under Section 564(b)(1) of the Act, 21 U.S.C. section 360bbb-3(b)(1), unless the authorization is terminated or revoked.     Resp Syncytial Virus by PCR NEGATIVE NEGATIVE Final    Comment: (NOTE) Fact Sheet for Patients: BloggerCourse.com  Fact Sheet for Healthcare Providers: SeriousBroker.it  This test is not yet approved or cleared by the [place] FDA and has been authorized for detection and/or diagnosis of SARS-CoV-2 by FDA under an Emergency Use Authorization (EUA). This EUA will remain in effect (meaning this test can be used) for the duration of the COVID-19 declaration under Section 564(b)(1) of the Act, 21 U.S.C. section 360bbb-3(b)(1), unless the authorization is terminated or revoked.  Performed at [facility], [address] [place], [place] [address]   Blood Culture (routine x 2)     Status: Abnormal (Preliminary result)   Collection Time: 02/16/23  5:35 PM   Specimen: BLOOD RIGHT ARM  Result Value Ref Range Status   Specimen Description   Final    BLOOD RIGHT ARM Performed at [facility], [address], [place], [place] [address]    Special Requests   Final    BOTTLES DRAWN AEROBIC AND ANAEROBIC Blood Culture adequate volume Performed at [facility], [address] [place], [place] [address]    Culture  Setup Time   Final    GRAM NEGATIVE RODS ANAEROBIC BOTTLE ONLY CRITICAL RESULT CALLED TO, READ BACK BY AND VERIFIED WITH: ASHLEY ORSUTO AT 0435 02/17/23.PMF Performed at [facility], [address] [place], [place] [address]    Culture ESCHERICHIA COLI (A)  Final   Report Status PENDING  Incomplete   Organism ID, Bacteria ESCHERICHIA  COLI  Final      Susceptibility   Escherichia coli - MIC*    AMPICILLIN >=32 RESISTANT Resistant     CEFEPIME <=0.12 SENSITIVE Sensitive     CEFTAZIDIME <=1 SENSITIVE Sensitive     CEFTRIAXONE <=0.25 SENSITIVE Sensitive     CIPROFLOXACIN <=0.25 SENSITIVE Sensitive     GENTAMICIN <=1 SENSITIVE Sensitive     IMIPENEM <=0.25 SENSITIVE Sensitive     TRIMETH/SULFA <=20 SENSITIVE Sensitive     AMPICILLIN/SULBACTAM >=32 RESISTANT Resistant     PIP/TAZO <=4 SENSITIVE Sensitive     * ESCHERICHIA COLI  Blood Culture (routine x 2)     Status: None (Preliminary result)   Collection Time: 02/16/23  5:35 PM   Specimen: BLOOD LEFT ARM  Result Value Ref Range Status   Specimen Description BLOOD LEFT ARM  Final   Special Requests   Final    BOTTLES DRAWN AEROBIC AND ANAEROBIC Blood Culture adequate volume   Culture   Final    NO GROWTH 3 DAYS Performed at [facility], [address] [place], [place] [address]    Report Status PENDING  Incomplete  Blood Culture ID Panel (Reflexed)  Status: Abnormal   Collection Time: 02/16/23  5:35 PM  Result Value Ref Range Status   Enterococcus faecalis NOT DETECTED NOT DETECTED Final   Enterococcus Faecium NOT DETECTED NOT DETECTED Final   Listeria monocytogenes NOT DETECTED NOT DETECTED Final   Staphylococcus species NOT DETECTED NOT DETECTED Final   Staphylococcus aureus (BCID) NOT DETECTED NOT DETECTED Final   Staphylococcus epidermidis NOT DETECTED NOT DETECTED Final   Staphylococcus lugdunensis NOT DETECTED NOT DETECTED Final   Streptococcus species NOT DETECTED NOT DETECTED Final   Streptococcus agalactiae NOT DETECTED NOT DETECTED Final   Streptococcus pneumoniae NOT DETECTED NOT DETECTED Final   Streptococcus pyogenes NOT DETECTED NOT DETECTED Final   A.calcoaceticus-baumannii NOT DETECTED NOT DETECTED Final   Bacteroides fragilis NOT DETECTED NOT DETECTED Final   Enterobacterales DETECTED (A) NOT DETECTED Final    Comment:  Enterobacterales represent a large order of gram negative bacteria, not a single organism. CRITICAL RESULT CALLED TO, READ BACK BY AND VERIFIED WITH: ASHLEY ORSUTO AT 0435 02/17/23.PMF    Enterobacter cloacae complex NOT DETECTED NOT DETECTED Final   Escherichia coli DETECTED (A) NOT DETECTED Final    Comment: CRITICAL RESULT CALLED TO, READ BACK BY AND VERIFIED WITH: ASHLEY ORSUTO AT 0435 02/17/23.PMF    Klebsiella aerogenes NOT DETECTED NOT DETECTED Final   Klebsiella oxytoca NOT DETECTED NOT DETECTED Final   Klebsiella pneumoniae NOT DETECTED NOT DETECTED Final   Proteus species NOT DETECTED NOT DETECTED Final   Salmonella species NOT DETECTED NOT DETECTED Final   Serratia marcescens NOT DETECTED NOT DETECTED Final   Haemophilus influenzae NOT DETECTED NOT DETECTED Final   Neisseria meningitidis NOT DETECTED NOT DETECTED Final   Pseudomonas aeruginosa NOT DETECTED NOT DETECTED Final   Stenotrophomonas maltophilia NOT DETECTED NOT DETECTED Final   Candida albicans NOT DETECTED NOT DETECTED Final   Candida auris NOT DETECTED NOT DETECTED Final   Candida glabrata NOT DETECTED NOT DETECTED Final   Candida krusei NOT DETECTED NOT DETECTED Final   Candida parapsilosis NOT DETECTED NOT DETECTED Final   Candida tropicalis NOT DETECTED NOT DETECTED Final   Cryptococcus neoformans/gattii NOT DETECTED NOT DETECTED Final   CTX-M ESBL NOT DETECTED NOT DETECTED Final   Carbapenem resistance IMP NOT DETECTED NOT DETECTED Final   Carbapenem resistance KPC NOT DETECTED NOT DETECTED Final   Carbapenem resistance NDM NOT DETECTED NOT DETECTED Final   Carbapenem resist OXA 48 LIKE NOT DETECTED NOT DETECTED Final   Carbapenem resistance VIM NOT DETECTED NOT DETECTED Final    Comment: Performed at [facility], [address] [place], [place] [address]  Blood culture (routine x 2)     Status: None (Preliminary result)   Collection Time: 02/17/23 12:39 PM   Specimen: BLOOD  Result Value Ref  Range Status   Specimen Description BLOOD BLOOD RIGHT ARM  Final   Special Requests   Final    BOTTLES DRAWN AEROBIC AND ANAEROBIC Blood Culture results may not be optimal due to an excessive volume of blood received in culture bottles   Culture   Final    NO GROWTH 2 DAYS Performed at [facility], [address] [place], [place] [address]    Report Status PENDING  Incomplete  Urine Culture     Status: None   Collection Time: 02/17/23 12:39 PM   Specimen: Urine, Random  Result Value Ref Range Status   Specimen Description   Final    URINE, RANDOM Performed at [facility], [address]  [place], [place] [address]    Special Requests URINE, CLEAN CATCH  Final   Culture   Final    NO GROWTH Performed at [facility], [address], [place], [place] [address]    Report Status 02/18/2023 FINAL  Final  Blood culture (routine x 2)     Status: None (Preliminary result)   Collection Time: 02/17/23 12:48 PM   Specimen: BLOOD  Result Value Ref Range Status   Specimen Description BLOOD BLOOD RIGHT ARM  Final   Special Requests   Final    BOTTLES DRAWN AEROBIC AND ANAEROBIC Blood Culture results may not be optimal due to an excessive volume of blood received in culture bottles   Culture   Final    NO GROWTH 2 DAYS Performed at [facility], [address] [place], [place] [address]    Report Status PENDING  Incomplete     Time coordinating discharge: Over 30 minutes  SIGNED:   Charise Killian, MD  [facility] 02/19/2023, 10:50 AM Pager   If 7PM-7AM, please contact night-coverage [email or web address]

## 2023-02-20 ENCOUNTER — Telehealth: Payer: Self-pay

## 2023-02-20 LAB — CULTURE, BLOOD (ROUTINE X 2)

## 2023-02-20 NOTE — Telephone Encounter (Signed)
Noted. Please offer the patient a sooner appointment for hospital follow-up. Thanks.

## 2023-02-20 NOTE — Telephone Encounter (Signed)
Patient is scheduled for 03/01/23 at 11:00.

## 2023-02-20 NOTE — Transitions of Care (Post Inpatient/ED Visit) (Signed)
   02/20/2023  Name: Heidi Hicks MRN: [id number] DOB: 11-05-1960  Today's TOC FU Call Status: Today's TOC FU Call Status:: Successful TOC FU Call Competed TOC FU Call Complete Date: 02/20/23  Transition Care Management Follow-up Telephone Call Date of Discharge: 01/19/23 Discharge Facility: [facility]) Type of Discharge: Inpatient Admission Primary Inpatient Discharge Diagnosis:: cystitis How have you been since you were released from the hospital?: Better Any questions or concerns?: No  Items Reviewed: Did you receive and understand the discharge instructions provided?: No Medications obtained,verified, and reconciled?: Yes (Medications Reviewed) Any new allergies since your discharge?: No Dietary orders reviewed?: NA Do you have support at home?: Yes People in Home: spouse  Medications Reviewed Today: Medications Reviewed Today     Reviewed by Karena Addison, LPN (Licensed Practical Nurse) on 02/20/23 at 1031  Med List Status: <None>   Medication Order Taking? Sig Documenting Provider Last Dose Status Informant  ciprofloxacin (CIPRO) 500 MG tablet [id number]  Take 1 tablet (500 mg total) by mouth 2 (two) times daily for 8 days. Charise Killian, MD  Active   losartan-hydrochlorothiazide [facility]) 50-12.5 MG tablet [id number] No TAKE 1 TABLET BY MOUTH DAILY  Patient taking differently: Take 1 tablet by mouth daily.   Glori Luis, MD 02/16/2023 Active Self  naproxen (NAPROSYN) 500 MG tablet [id number] No Take 1 tablet (500 mg total) by mouth 2 (two) times daily with a meal. Sharman Cheek, MD 02/16/2023 Active Self  ondansetron (ZOFRAN-ODT) 4 MG disintegrating tablet [id number] No Take 1 tablet (4 mg total) by mouth every 8 (eight) hours as needed for nausea or vomiting. Sharman Cheek, MD 02/16/2023 Active Self            Home Care and Equipment/Supplies: Were Home Health Services Ordered?: NA Any new equipment or medical supplies  ordered?: NA  Functional Questionnaire: Do you need assistance with bathing/showering or dressing?: No Do you need assistance with meal preparation?: No Do you need assistance with eating?: No Do you have difficulty maintaining continence: No Do you need assistance with getting out of bed/getting out of a chair/moving?: No Do you have difficulty managing or taking your medications?: No  Follow up appointments reviewed: Specialist Hospital Follow-up appointment confirmed?: NA Do you need transportation to your follow-up appointment?: No Do you understand care options if your condition(s) worsen?: Yes-patient verbalized understanding    SIGNATURE Karena Addison, LPN [facility] Nurse Health Advisor Direct Dial [phone]

## 2023-02-21 LAB — CULTURE, BLOOD (ROUTINE X 2)
Culture: NO GROWTH
Special Requests: ADEQUATE

## 2023-02-22 LAB — CULTURE, BLOOD (ROUTINE X 2): Culture: NO GROWTH

## 2023-03-01 ENCOUNTER — Ambulatory Visit: Payer: BC Managed Care – PPO | Admitting: Family Medicine

## 2023-03-14 ENCOUNTER — Ambulatory Visit: Payer: 59 | Admitting: Family Medicine

## 2023-03-22 ENCOUNTER — Ambulatory Visit
Admission: EM | Admit: 2023-03-22 | Discharge: 2023-03-22 | Disposition: A | Discharge status: Home / Self Care | Payer: BC Managed Care – PPO | Attending: Family Medicine | Admitting: Family Medicine

## 2023-03-22 ENCOUNTER — Encounter (INDEPENDENT_AMBULATORY_CARE_PROVIDER_SITE_OTHER): Payer: Self-pay

## 2023-03-22 DIAGNOSIS — R238 Other skin changes: Secondary | ICD-10-CM

## 2023-03-22 DIAGNOSIS — L03113 Cellulitis of right upper limb: Secondary | ICD-10-CM | POA: Diagnosis not present

## 2023-03-22 MED ORDER — PREDNISONE 20 MG PO TABS: 20.0000 mg | ORAL_TABLET | Freq: Every day | ORAL | 0 refills | Status: DC

## 2023-03-22 MED ORDER — TRIAMCINOLONE ACETONIDE 0.025 % EX CREA: 1.0000 | TOPICAL_CREAM | Freq: Three times a day (TID) | CUTANEOUS | 0 refills | Status: DC | PRN

## 2023-03-22 MED ORDER — DOXYCYCLINE HYCLATE 100 MG PO CAPS: 100.0000 mg | ORAL_CAPSULE | Freq: Two times a day (BID) | ORAL | 0 refills | Status: AC

## 2023-03-22 NOTE — ED Provider Notes (Signed)
Heidi Hicks    CSN: [id number] Arrival date & time: 03/22/23  5643      History   Chief Complaint Chief Complaint  Patient presents with   Rash    HPI Heidi Hicks is a 62 y.o. female, with a history of hypertension, prediabetes and arthritis.  HPI Patient presents for evaluation of a blistery rash that started as bumps on her wrist and has expanded to her elbow. The right elbow is swollen with papules and blistery lesions. She reports recent travel to beach and is concerned that something could have stung or bit her while on vacation. The rash and blisters have been present for a total of 7 days.  Past Medical History:  Diagnosis Date   Arthritis    Hypertension     Patient Active Problem List   Diagnosis Date Noted   Morbid obesity with BMI of 40.0-44.9, adult (HCC) 02/17/2023   Hypokalemia 02/17/2023   Hypophosphatemia 02/17/2023   Prediabetes 07/26/2021   Postmenopausal bleeding 08/17/2020   BRBPR (bright red blood per rectum) 08/08/2020   Psoriasis 10/10/2019   Left ankle pain 05/05/2016   Obesity 10/01/2013   Arthralgia 02/04/2013   Hypertension 12/29/2011    Past Surgical History:  Procedure Laterality Date   abkle fusion  1990   BREAST SURGERY  2002   reduction   broken ankle  1988   CESAREAN SECTION     1981, 1989, 1997   COLONOSCOPY     GALLBLADDER SURGERY  2009   HYSTEROSCOPY WITH D & C N/A 09/03/2020   Procedure: DILATATION AND CURETTAGE /HYSTEROSCOPY, polypectomy;  Surgeon: Natale Milch, MD;  Location: [facility] ORS;  Service: Gynecology;  Laterality: N/A;   POLYPECTOMY     REDUCTION MAMMAPLASTY Bilateral 2002   anchor reduction scars   TONSILLECTOMY AND ADENOIDECTOMY  1971    OB History     Gravida  3   Para  3   Term  3   Preterm      AB      Living  3      SAB      IAB      Ectopic      Multiple      Live Births               Home Medications    Prior to Admission medications   Medication  Sig Start Date End Date Taking? Authorizing Provider  doxycycline (VIBRAMYCIN) 100 MG capsule Take 1 capsule (100 mg total) by mouth 2 (two) times daily for 7 days. 03/22/23 03/29/23 Yes Bing Neighbors, NP  predniSONE (DELTASONE) 20 MG tablet Take 1 tablet (20 mg total) by mouth daily with breakfast for 5 days. 03/22/23 03/27/23 Yes Bing Neighbors, NP  triamcinolone (KENALOG) 0.025 % cream Apply 1 Application topically 3 (three) times daily as needed (skin rash). 03/22/23  Yes Bing Neighbors, NP  losartan-hydrochlorothiazide (HYZAAR) 50-12.5 MG tablet TAKE 1 TABLET BY MOUTH DAILY Patient taking differently: Take 1 tablet by mouth daily. 02/16/23   Glori Luis, MD  naproxen (NAPROSYN) 500 MG tablet Take 1 tablet (500 mg total) by mouth 2 (two) times daily with a meal. 02/16/23   Sharman Cheek, MD  ondansetron (ZOFRAN-ODT) 4 MG disintegrating tablet Take 1 tablet (4 mg total) by mouth every 8 (eight) hours as needed for nausea or vomiting. 02/16/23   Sharman Cheek, MD    Family History Family History  Problem Relation Age of Onset  Hypertension Mother    Diabetes Mother    Renal cancer Mother 75   Hypertension Father    Diabetes Father    Diabetes Sister    Colon polyps Neg Hx    Esophageal cancer Neg Hx    Rectal cancer Neg Hx    Stomach cancer Neg Hx    Colon cancer Neg Hx    Pancreatic cancer Neg Hx    Liver disease Neg Hx     Social History Social History   Tobacco Use   Smoking status: Never   Smokeless tobacco: Never  Vaping Use   Vaping status: Never Used  Substance Use Topics   Alcohol use: No   Drug use: No     Allergies   Ace inhibitors   Review of Systems Review of Systems Pertinent negatives listed in HPI  Physical Exam Triage Vital Signs ED Triage Vitals  Encounter Vitals Group     BP 03/22/23 0905 127/84     Systolic BP Percentile --      Diastolic BP Percentile --      Pulse Rate 03/22/23 0905 87     Resp 03/22/23 0905 18      Temp 03/22/23 0905 97.6 F (36.4 C)     Temp src --      SpO2 03/22/23 0905 96 %     Weight --      Height --      Head Circumference --      Peak Flow --      Pain Score 03/22/23 0904 0     Pain Loc --      Pain Education --      Exclude from Growth Chart --    No data found.  Updated Vital Signs BP 127/84   Pulse 87   Temp 97.6 F (36.4 C)   Resp 18   LMP 07/23/2011   SpO2 96%   Visual Acuity Right Eye Distance:   Left Eye Distance:   Bilateral Distance:    Right Eye Near:   Left Eye Near:    Bilateral Near:     Physical Exam Vitals reviewed.  Constitutional:      Appearance: Normal appearance.  HENT:     Head: Normocephalic and atraumatic.  Eyes:     Extraocular Movements: Extraocular movements intact.     Conjunctiva/sclera: Conjunctivae normal.     Pupils: Pupils are equal, round, and reactive to light.  Cardiovascular:     Rate and Rhythm: Normal rate and regular rhythm.  Pulmonary:     Effort: Pulmonary effort is normal.     Breath sounds: Normal breath sounds.  Skin:    General: Skin is warm.     Findings: Erythema and rash present. Rash is vesicular.       Neurological:     General: No focal deficit present.     Mental Status: She is alert.      UC Treatments / Results  Labs (all labs ordered are listed, but only abnormal results are displayed) Labs Reviewed - No data to display  EKG   Radiology No results found.  Procedures Procedures (including critical care time)  Medications Ordered in UC Medications - No data to display  Initial Impression / Assessment and Plan / UC Course  I have reviewed the triage vital signs and the nursing notes.  Pertinent labs & imaging results that were available during my care of the patient were reviewed by me and considered in my medical  decision making (see chart for details).    Rash, Vesicular  and Cellulitis right upper extremity  Differential include insect bite, shingles, or sting  reaction. Shingles less likely given distribution is clustered and there is visible skin eruption of hive like lesion present. Will treat symptoms prednisone 20 mg daily x 5 days for skin eruption/rash,. Concern for cellulitis involving the right elbow, will cover with Doxycycline 100 mg twice daily x 7 days.  Localized irritation, apply topical triamcinolone cream 3 times daily as needed.  Strict return precautions given if symptoms worsen or do not improve. Final Clinical Impressions(s) / UC Diagnoses   Final diagnoses:  Rash, vesicular  Cellulitis of right upper extremity     Discharge Instructions      Start prednisone 20 mg today take daily for total 5 days.  You may apply topical triamcinolone cream to affected area up to 3 times daily as needed this will help reduce itching.  Start doxycycline 100 mg twice daily for total of 7 days.  If any point the rash begins to worsen or is not improving return for evaluation.     ED Prescriptions     Medication Sig Dispense Auth. Provider   doxycycline (VIBRAMYCIN) 100 MG capsule Take 1 capsule (100 mg total) by mouth 2 (two) times daily for 7 days. 14 capsule Bing Neighbors, NP   triamcinolone (KENALOG) 0.025 % cream Apply 1 Application topically 3 (three) times daily as needed (skin rash). 30 g Bing Neighbors, NP   predniSONE (DELTASONE) 20 MG tablet Take 1 tablet (20 mg total) by mouth daily with breakfast for 5 days. 5 tablet Bing Neighbors, NP      PDMP not reviewed this encounter.   Bing Neighbors, NP 03/22/23 1048

## 2023-03-22 NOTE — ED Triage Notes (Signed)
Patient to Urgent Care with complaints of painful rash/ blisters present to right wrist and right elbow. Sore to touch.  Symptoms started seven days ago. Reports she was staying [place] the beach. Woke up with symptoms.

## 2023-03-22 NOTE — Discharge Instructions (Addendum)
Start prednisone 20 mg today take daily for total 5 days.  You may apply topical triamcinolone cream to affected area up to 3 times daily as needed this will help reduce itching.  Start doxycycline 100 mg twice daily for total of 7 days.  If any point the rash begins to worsen or is not improving return for evaluation.

## 2023-03-24 ENCOUNTER — Ambulatory Visit: Payer: BC Managed Care – PPO | Admitting: Nurse Practitioner

## 2023-03-24 ENCOUNTER — Encounter: Payer: Self-pay | Admitting: Nurse Practitioner

## 2023-03-24 VITALS — BP 124/76 | HR 91 | Temp 97.6°F | Ht 69.02 in | Wt 288.6 lb

## 2023-03-24 DIAGNOSIS — R21 Rash and other nonspecific skin eruption: Secondary | ICD-10-CM | POA: Diagnosis not present

## 2023-03-24 MED ORDER — CLOTRIMAZOLE-BETAMETHASONE 1-0.05 % EX CREA: 1.0000 | TOPICAL_CREAM | Freq: Two times a day (BID) | CUTANEOUS | 0 refills | Status: AC

## 2023-03-24 MED ORDER — PREDNISONE 10 MG PO TABS: ORAL_TABLET | ORAL | 0 refills | Status: DC

## 2023-03-24 NOTE — Patient Instructions (Addendum)
Please stop the previous dose of prednisone and start the new dose. Use Lotrisone cream for itching. Continue doxycyline.  If symptoms not improving call the office for further evaluation.

## 2023-03-24 NOTE — Progress Notes (Unsigned)
Established Patient Office Visit  Subjective:  Patient ID: Heidi Hicks, female    DOB: 23-Jan-1961  Age: 62 y.o. MRN: [id number]  CC:  Chief Complaint  Patient presents with   Acute Visit    Rash    HPI  Heidi Hicks presents for rash. Patient states that she was on vacation last week and woke up with the rash. The rash is on her right wrist that started as a bump and now has multiple blisters. She states that the rash expanded to elbow.  The rashes are itchy and burns.  She went to the urgent care 2 days ago and was started on doxycycline, prednisone and triamcinolone.  She states that the rashes are still itchy and burns while taking the medication. HPI   Past Medical History:  Diagnosis Date   Arthritis    Hypertension     Past Surgical History:  Procedure Laterality Date   abkle fusion  1990   BREAST SURGERY  2002   reduction   broken ankle  1988   CESAREAN SECTION     1981, 1989, 1997   COLONOSCOPY     GALLBLADDER SURGERY  2009   HYSTEROSCOPY WITH D & C N/A 09/03/2020   Procedure: DILATATION AND CURETTAGE /HYSTEROSCOPY, polypectomy;  Surgeon: Natale Milch, MD;  Location: [facility] ORS;  Service: Gynecology;  Laterality: N/A;   POLYPECTOMY     REDUCTION MAMMAPLASTY Bilateral 2002   anchor reduction scars   TONSILLECTOMY AND ADENOIDECTOMY  1971    Family History  Problem Relation Age of Onset   Hypertension Mother    Diabetes Mother    Renal cancer Mother 30   Hypertension Father    Diabetes Father    Diabetes Sister    Colon polyps Neg Hx    Esophageal cancer Neg Hx    Rectal cancer Neg Hx    Stomach cancer Neg Hx    Colon cancer Neg Hx    Pancreatic cancer Neg Hx    Liver disease Neg Hx     Social History   Socioeconomic History   Marital status: Married    Spouse name: Not on file   Number of children: Not on file   Years of education: Not on file   Highest education level: Not on file  Occupational History   Not on file  Tobacco  Use   Smoking status: Never   Smokeless tobacco: Never  Vaping Use   Vaping status: Never Used  Substance and Sexual Activity   Alcohol use: No   Drug use: No   Sexual activity: Not Currently  Other Topics Concern   Not on file  Social History Narrative   Lives in [place] with husband.    3 children. Dog in house.   Work: Advertising account planner, [employer]   Diet: regular   Exercise: walks 3-4 times per week   Social Determinants of Corporate investment banker Strain: Not on file  Food Insecurity: No Food Insecurity (02/18/2023)   Hunger Vital Sign    Worried About Running Out of Food in the Last Year: Never true    Ran Out of Food in the Last Year: Never true  Transportation Needs: No Transportation Needs (02/18/2023)   PRAPARE - Administrator, Civil Service (Medical): No    Lack of Transportation (Non-Medical): No  Physical Activity: Not on file  Stress: Not on file  Social Connections: Not on file  Intimate Partner Violence: Not At Risk (  02/18/2023)   Humiliation, Afraid, Rape, and Kick questionnaire    Fear of Current or Ex-Partner: No    Emotionally Abused: No    Physically Abused: No    Sexually Abused: No     Outpatient Medications Prior to Visit  Medication Sig Dispense Refill   doxycycline (VIBRAMYCIN) 100 MG capsule Take 1 capsule (100 mg total) by mouth 2 (two) times daily for 7 days. 14 capsule 0   losartan-hydrochlorothiazide (HYZAAR) 50-12.5 MG tablet TAKE 1 TABLET BY MOUTH DAILY (Patient taking differently: Take 1 tablet by mouth daily.) 30 tablet 0   meloxicam (MOBIC) 7.5 MG tablet Take 7.5 mg by mouth daily.     triamcinolone (KENALOG) 0.025 % cream Apply 1 Application topically 3 (three) times daily as needed (skin rash). 30 g 0   naproxen (NAPROSYN) 500 MG tablet Take 1 tablet (500 mg total) by mouth 2 (two) times daily with a meal. 20 tablet 0   ondansetron (ZOFRAN-ODT) 4 MG disintegrating tablet Take 1 tablet (4 mg total) by mouth every 8  (eight) hours as needed for nausea or vomiting. 20 tablet 0   predniSONE (DELTASONE) 20 MG tablet Take 1 tablet (20 mg total) by mouth daily with breakfast for 5 days. 5 tablet 0   No facility-administered medications prior to visit.    Allergies  Allergen Reactions   Ace Inhibitors Cough    No cough or complaints with Losartan.     ROS Review of Systems Negative unless indicated in HPI.    Objective:    Physical Exam Constitutional:      Appearance: Normal appearance.  Cardiovascular:     Rate and Rhythm: Normal rate and regular rhythm.     Pulses: Normal pulses.     Heart sounds: Normal heart sounds.  Musculoskeletal:     Cervical back: Normal range of motion.     Comments: Multiple blistery rash in the circular area on the right wrist and scattered blistery rash on the right elbow.  Neurological:     General: No focal deficit present.     Mental Status: She is alert. Mental status is at baseline.  Psychiatric:        Mood and Affect: Mood normal.        Behavior: Behavior normal.        Thought Content: Thought content normal.        Judgment: Judgment normal.     BP 124/76   Pulse 91   Temp 97.6 F (36.4 C)   Ht 5' 9.02" (1.753 m)   Wt 288 lb 9.6 oz (130.9 kg)   LMP 07/23/2011   SpO2 94%   BMI 42.59 kg/m  Wt Readings from Last 3 Encounters:  03/24/23 288 lb 9.6 oz (130.9 kg)  02/18/23 285 lb (129.3 kg)  02/16/23 285 lb (129.3 kg)     Health Maintenance  Topic Date Due   Zoster Vaccines- Shingrix (1 of 2) Never done   COVID-19 Vaccine (4 - 2023-24 season) 04/22/2022   INFLUENZA VACCINE  11/20/2023 (Originally 03/23/2023)   DTaP/Tdap/Td (2 - Td or Tdap) 03/28/2023   MAMMOGRAM  01/19/2024   PAP SMEAR-Modifier  07/13/2025   Colonoscopy  05/07/2028   Hepatitis C Screening  Completed   HIV Screening  Completed   HPV VACCINES  Aged Out    There are no preventive care reminders to display for this patient.  Lab Results  Component Value Date   TSH  1.49 12/25/2014   Lab Results  Component Value Date   WBC 5.6 02/19/2023   HGB 11.8 (L) 02/19/2023   HCT 37.2 02/19/2023   MCV 89.6 02/19/2023   PLT 242 02/19/2023   Lab Results  Component Value Date   NA 139 02/19/2023   K 3.9 02/19/2023   CO2 24 02/19/2023   GLUCOSE 99 02/19/2023   BUN 14 02/19/2023   CREATININE 0.92 02/19/2023   BILITOT 0.8 02/17/2023   ALKPHOS 97 02/17/2023   AST 37 02/17/2023   ALT 26 02/17/2023   PROT 6.6 02/17/2023   ALBUMIN 3.2 (L) 02/17/2023   CALCIUM 8.4 (L) 02/19/2023   ANIONGAP 6 02/19/2023   GFR 61.25 08/31/2022   Lab Results  Component Value Date   CHOL 173 07/19/2022   Lab Results  Component Value Date   HDL 47.30 07/19/2022   Lab Results  Component Value Date   LDLCALC 100 (H) 07/19/2022   Lab Results  Component Value Date   TRIG 128.0 07/19/2022   Lab Results  Component Value Date   CHOLHDL 4 07/19/2022   Lab Results  Component Value Date   HGBA1C 6.4 07/19/2022      Assessment & Plan:  Rash Assessment & Plan: Advised to continue doxycycline.  Will stop the present dose of prednisone and start 8 days tapering prednisone and Lotrisone cream. If symptoms not improving call the office for further evaluation.   Other orders -     Clotrimazole-Betamethasone; Apply 1 Application topically 2 (two) times daily.  Dispense: 30 g; Refill: 0 -     predniSONE; Take 4 tablets ( total 40 mg) by mouth for 2 days; take 3 tablets ( total 30 mg) by mouth for 2 days; take 2 tablets (total 20mg ) by mouth for 2 days; then take 1 tablet ( total 10mg ) by mouth for 2 days.  Dispense: 20 tablet; Refill: 0    Follow-up: Return if symptoms worsen or fail to improve.   Kara Dies, NP

## 2023-03-26 DIAGNOSIS — R21 Rash and other nonspecific skin eruption: Secondary | ICD-10-CM | POA: Insufficient documentation

## 2023-03-26 NOTE — Assessment & Plan Note (Signed)
Advised to continue doxycycline.  Will stop the present dose of prednisone and start 8 days tapering prednisone and Lotrisone cream. If symptoms not improving call the office for further evaluation.

## 2023-03-27 ENCOUNTER — Other Ambulatory Visit: Payer: Self-pay | Admitting: Nurse Practitioner

## 2023-03-30 ENCOUNTER — Other Ambulatory Visit: Payer: Self-pay | Admitting: Family Medicine

## 2023-03-30 DIAGNOSIS — I10 Essential (primary) hypertension: Secondary | ICD-10-CM

## 2023-04-05 ENCOUNTER — Encounter: Payer: Self-pay | Admitting: Family Medicine

## 2023-04-05 ENCOUNTER — Telehealth: Payer: BC Managed Care – PPO | Admitting: Family Medicine

## 2023-04-05 VITALS — Temp 99.1°F | Ht 69.0 in | Wt 288.0 lb

## 2023-04-05 DIAGNOSIS — I10 Essential (primary) hypertension: Secondary | ICD-10-CM | POA: Diagnosis not present

## 2023-04-05 DIAGNOSIS — N390 Urinary tract infection, site not specified: Secondary | ICD-10-CM

## 2023-04-05 DIAGNOSIS — R21 Rash and other nonspecific skin eruption: Secondary | ICD-10-CM | POA: Diagnosis not present

## 2023-04-05 DIAGNOSIS — U071 COVID-19: Secondary | ICD-10-CM | POA: Insufficient documentation

## 2023-04-05 DIAGNOSIS — R319 Hematuria, unspecified: Secondary | ICD-10-CM

## 2023-04-05 HISTORY — DX: COVID-19: U07.1

## 2023-04-05 NOTE — Progress Notes (Signed)
Virtual Visit via video Note  I connected with Heidi Hicks today at 11:20 AM EDT by a video enabled telemedicine application and verified that I am speaking with the correct person using two identifiers. Location patient: home Location provider: work Persons participating in the virtual visit: patient, provider  I discussed the limitations, risks, security and privacy concerns of performing an evaluation and management service by telephone and the availability of in person appointments. I also discussed with the patient that there may be a patient responsible charge related to this service. The patient expressed understanding and agreed to proceed.   Reason for visit: f/u.  HPI: HYPERTENSION Disease Monitoring Home BP Monitoring 116/82 when last checked chest pain- no    Dyspnea- no Medications Compliance-taking losartan/HCTZ.   Edema-no BMET    Component Value Date/Time   NA 139 02/19/2023 0452   K 3.9 02/19/2023 0452   CL 109 02/19/2023 0452   CO2 24 02/19/2023 0452   GLUCOSE 99 02/19/2023 0452   BUN 14 02/19/2023 0452   CREATININE 0.92 02/19/2023 0452   CALCIUM 8.4 (L) 02/19/2023 0452   GFRNONAA >60 02/19/2023 2956   Urosepsis: Patient notes she has recovered well from this.  Rash: Patient was seen recently for a rash by one of her other providers.  She was also seen at urgent care.  Patient notes the rash is improving.  She is using the topical medication on it.  She wonders if she should get the shingles vaccine even though she notes she never had chickenpox.  COVID-19: Patient notes symptoms started 6 days ago.  Had a scratchy throat.  She was taking DayQuil although stopped that and continue to feel poorly and tested on Monday.  She has had cough, congestion, headaches, and fatigue.  No fever or shortness of breath.  No sore throat.  She has taken DayQuil and TheraFlu.  TheraFlu has been helpful.  She feels as though she is getting somewhat better.   ROS: See  pertinent positives and negatives per HPI.  Past Medical History:  Diagnosis Date   Arthritis    Hypertension     Past Surgical History:  Procedure Laterality Date   abkle fusion  1990   BREAST SURGERY  2002   reduction   broken ankle  1988   CESAREAN SECTION     1981, 1989, 1997   COLONOSCOPY     GALLBLADDER SURGERY  2009   HYSTEROSCOPY WITH D & C N/A 09/03/2020   Procedure: DILATATION AND CURETTAGE /HYSTEROSCOPY, polypectomy;  Surgeon: Natale Milch, MD;  Location: [facility];  Service: Gynecology;  Laterality: N/A;   POLYPECTOMY     REDUCTION MAMMAPLASTY Bilateral 2002   anchor reduction scars   TONSILLECTOMY AND ADENOIDECTOMY  1971    Family History  Problem Relation Age of Onset   Hypertension Mother    Diabetes Mother    Renal cancer Mother 78   Hypertension Father    Diabetes Father    Diabetes Sister    Colon polyps Neg Hx    Esophageal cancer Neg Hx    Rectal cancer Neg Hx    Stomach cancer Neg Hx    Colon cancer Neg Hx    Pancreatic cancer Neg Hx    Liver disease Neg Hx     SOCIAL HX: Non-smoker   Current Outpatient Medications:    clotrimazole-betamethasone (LOTRISONE) cream, Apply 1 Application topically 2 (two) times daily., Disp: 30 g, Rfl: 0   losartan-hydrochlorothiazide (HYZAAR) 50-12.5 MG tablet,  TAKE 1 TABLET BY MOUTH EVERY DAY, Disp: 90 tablet, Rfl: 1   meloxicam (MOBIC) 7.5 MG tablet, Take 7.5 mg by mouth daily., Disp: , Rfl:    predniSONE (DELTASONE) 10 MG tablet, Take 4 tablets ( total 40 mg) by mouth for 2 days; take 3 tablets ( total 30 mg) by mouth for 2 days; take 2 tablets (total 20mg ) by mouth for 2 days; then take 1 tablet ( total 10mg ) by mouth for 2 days., Disp: 20 tablet, Rfl: 0   triamcinolone (KENALOG) 0.025 % cream, Apply 1 Application topically 3 (three) times daily as needed (skin rash)., Disp: 30 g, Rfl: 0  EXAM:  VITALS per patient if applicable:  GENERAL: alert, oriented, appears well and in no acute  distress  HEENT: atraumatic, conjunttiva clear, no obvious abnormalities on inspection of external nose and ears  NECK: normal movements of the head and neck  LUNGS: on inspection no signs of respiratory distress, breathing rate appears normal, no obvious gross SOB, gasping or wheezing  CV: no obvious cyanosis  MS: moves all visible extremities without noticeable abnormality  PSYCH/NEURO: pleasant and cooperative, no obvious depression or anxiety, speech and thought processing grossly intact  ASSESSMENT AND PLAN:  Discussed the following assessment and plan:  Problem List Items Addressed This Visit     COVID    Discussed the patient is outside treatment window for COVID-specific medications.  Discussed at this point treatment is supportive care.  She can continue TheraFlu.  If needed we can prescribe prescription cough suppressant.  She can take ibuprofen or Tylenol for body aches.  If developing worsening symptoms or new symptoms she will let us know.  Discussed she can come off of general quarantine when she has started to feel better for a day or so.  She will wear a mask through next Monday.      Hypertension - Primary (Chronic)    Chronic issue.  Adequately controlled.  Continue losartan-HCTZ 50-12.5 mg daily.      Rash    Discussed that the patient's rash could have been shingles, cellulitis, or some other issue.  This has been improving.  Discussed she should get the shingles vaccine once it resolves.  Discussed that it is very likely that she was exposed to chickenpox even though she did not have the rash.  If it recurs she will let us know.      UTI (urinary tract infection)    Patient will monitor for any recurrent symptoms.  Recheck urinalysis to determine if blood has resolved.      Relevant Orders   POCT Urinalysis Dipstick     Return in about 1 week (around 04/12/2023) for lab visit, 6 months PCP.   I discussed the assessment and treatment plan with the patient.  The patient was provided an opportunity to ask questions and all were answered. The patient agreed with the plan and demonstrated an understanding of the instructions.   The patient was advised to call back or seek an in-person evaluation if the symptoms worsen or if the condition fails to improve as anticipated.   Marikay Alar, MD

## 2023-04-05 NOTE — Assessment & Plan Note (Signed)
Chronic issue.  Adequately controlled.  Continue losartan-HCTZ 50-12.5 mg daily.

## 2023-04-05 NOTE — Assessment & Plan Note (Signed)
Discussed the patient is outside treatment window for COVID-specific medications.  Discussed at this point treatment is supportive care.  She can continue TheraFlu.  If needed we can prescribe prescription cough suppressant.  She can take ibuprofen or Tylenol for body aches.  If developing worsening symptoms or new symptoms she will let us know.  Discussed she can come off of general quarantine when she has started to feel better for a day or so.  She will wear a mask through next Monday.

## 2023-04-05 NOTE — Assessment & Plan Note (Signed)
Patient will monitor for any recurrent symptoms.  Recheck urinalysis to determine if blood has resolved.

## 2023-04-05 NOTE — Assessment & Plan Note (Signed)
Discussed that the patient's rash could have been shingles, cellulitis, or some other issue.  This has been improving.  Discussed she should get the shingles vaccine once it resolves.  Discussed that it is very likely that she was exposed to chickenpox even though she did not have the rash.  If it recurs she will let us know.

## 2023-04-11 ENCOUNTER — Telehealth: Payer: Self-pay | Admitting: Family Medicine

## 2023-04-11 NOTE — Telephone Encounter (Signed)
Patient has a future point care urinalysis already in the chart.  This is what needs to be collected during her lab appointment.  Thanks.

## 2023-04-11 NOTE — Telephone Encounter (Signed)
Patient need lab orders.

## 2023-04-12 ENCOUNTER — Other Ambulatory Visit (INDEPENDENT_AMBULATORY_CARE_PROVIDER_SITE_OTHER): Payer: BC Managed Care – PPO

## 2023-04-12 DIAGNOSIS — R319 Hematuria, unspecified: Secondary | ICD-10-CM | POA: Diagnosis not present

## 2023-04-12 DIAGNOSIS — N390 Urinary tract infection, site not specified: Secondary | ICD-10-CM

## 2023-04-12 LAB — POCT URINALYSIS DIPSTICK
Bilirubin, UA: NEGATIVE
Blood, UA: NEGATIVE
Glucose, UA: NEGATIVE
Ketones, UA: NEGATIVE
Leukocytes, UA: NEGATIVE
Nitrite, UA: NEGATIVE
Protein, UA: NEGATIVE
Spec Grav, UA: 1.02 (ref 1.010–1.025)
Urobilinogen, UA: 0.2 E.U./dL
pH, UA: 6 (ref 5.0–8.0)

## 2023-05-17 ENCOUNTER — Other Ambulatory Visit: Payer: Self-pay | Admitting: Family Medicine

## 2023-05-17 DIAGNOSIS — M25572 Pain in left ankle and joints of left foot: Secondary | ICD-10-CM

## 2023-08-14 ENCOUNTER — Other Ambulatory Visit: Payer: Self-pay | Admitting: Family Medicine

## 2023-08-14 DIAGNOSIS — I10 Essential (primary) hypertension: Secondary | ICD-10-CM

## 2023-09-10 ENCOUNTER — Other Ambulatory Visit: Payer: Self-pay | Admitting: Family Medicine

## 2023-09-10 DIAGNOSIS — M25572 Pain in left ankle and joints of left foot: Secondary | ICD-10-CM

## 2023-10-10 ENCOUNTER — Ambulatory Visit: Payer: BC Managed Care – PPO | Admitting: Family Medicine

## 2023-10-10 ENCOUNTER — Other Ambulatory Visit: Payer: Self-pay | Admitting: Family Medicine

## 2023-10-10 DIAGNOSIS — M25572 Pain in left ankle and joints of left foot: Secondary | ICD-10-CM

## 2023-10-12 ENCOUNTER — Encounter: Payer: BC Managed Care – PPO | Admitting: Nurse Practitioner

## 2023-10-27 ENCOUNTER — Encounter: Payer: BC Managed Care – PPO | Admitting: Nurse Practitioner

## 2023-11-02 ENCOUNTER — Ambulatory Visit: Admitting: Nurse Practitioner

## 2023-11-24 ENCOUNTER — Other Ambulatory Visit: Payer: Self-pay

## 2023-11-24 DIAGNOSIS — M25572 Pain in left ankle and joints of left foot: Secondary | ICD-10-CM

## 2023-11-24 MED ORDER — MELOXICAM 7.5 MG PO TABS
7.5000 mg | ORAL_TABLET | Freq: Every day | ORAL | 1 refills | Status: DC
Start: 2023-11-24 — End: 2023-12-29

## 2023-12-29 ENCOUNTER — Ambulatory Visit

## 2023-12-29 VITALS — BP 110/76 | HR 76 | Temp 97.6°F | Ht 69.0 in | Wt 296.2 lb

## 2023-12-29 DIAGNOSIS — I10 Essential (primary) hypertension: Secondary | ICD-10-CM | POA: Diagnosis not present

## 2023-12-29 DIAGNOSIS — Z6841 Body Mass Index (BMI) 40.0 and over, adult: Secondary | ICD-10-CM | POA: Diagnosis not present

## 2023-12-29 DIAGNOSIS — R7303 Prediabetes: Secondary | ICD-10-CM

## 2023-12-29 DIAGNOSIS — G8929 Other chronic pain: Secondary | ICD-10-CM

## 2023-12-29 DIAGNOSIS — M25572 Pain in left ankle and joints of left foot: Secondary | ICD-10-CM

## 2023-12-29 LAB — COMPREHENSIVE METABOLIC PANEL WITH GFR
ALT: 20 U/L (ref 0–35)
AST: 20 U/L (ref 0–37)
Albumin: 4.2 g/dL (ref 3.5–5.2)
Alkaline Phosphatase: 86 U/L (ref 39–117)
BUN: 16 mg/dL (ref 6–23)
CO2: 29 meq/L (ref 19–32)
Calcium: 8.9 mg/dL (ref 8.4–10.5)
Chloride: 103 meq/L (ref 96–112)
Creatinine, Ser: 0.92 mg/dL (ref 0.40–1.20)
GFR: 67.07 mL/min (ref >60.00)
Glucose, Bld: 84 mg/dL (ref 70–99)
Potassium: 3.5 meq/L (ref 3.5–5.1)
Sodium: 141 meq/L (ref 135–145)
Total Bilirubin: 0.4 mg/dL (ref 0.2–1.2)
Total Protein: 7.5 g/dL (ref 6.0–8.3)

## 2023-12-29 LAB — VITAMIN D 25 HYDROXY (VIT D DEFICIENCY, FRACTURES): VITD: 36.05 ng/mL (ref 30.00–100.00)

## 2023-12-29 LAB — LIPID PANEL
Cholesterol: 171 mg/dL (ref 0–200)
HDL: 50.5 mg/dL (ref >39.00)
LDL Cholesterol: 93 mg/dL (ref 0–99)
NonHDL: 120.64
Total CHOL/HDL Ratio: 3
Triglycerides: 139 mg/dL (ref 0.0–149.0)
VLDL: 27.8 mg/dL (ref 0.0–40.0)

## 2023-12-29 LAB — HEMOGLOBIN A1C: Hgb A1c MFr Bld: 6.3 % (ref 4.6–6.5)

## 2023-12-29 MED ORDER — LOSARTAN POTASSIUM-HCTZ 50-12.5 MG PO TABS: 1.0000 | ORAL_TABLET | Freq: Every day | ORAL | 1 refills | Status: DC

## 2023-12-29 MED ORDER — MELOXICAM 7.5 MG PO TABS: 7.5000 mg | ORAL_TABLET | Freq: Every day | ORAL | 1 refills | Status: DC

## 2023-12-29 NOTE — Assessment & Plan Note (Signed)
 Chronic issue.  Adequately controlled. Continue Losartan -HCTZ 50-12.5 mg daily. Refill sent. Check CMP today.

## 2023-12-29 NOTE — Assessment & Plan Note (Addendum)
-   Check vitamin D  - Weight loss through calorie-controlled, balanced diet, information on prediabetic diet provided. Increase moderate-intensity physical activity (at least 150 min/week of 30 min for 5 days a week). Given left ankle pain, patient will benefit from pharmacological intervention along lifestyle modification.  We will try intense life style modification for 8 weeks and have her f/u in 8 weeks to discuss if pharmacological intervention (GLP-1 RA Wegovy, Zepbound, Cvontrave).

## 2023-12-29 NOTE — Assessment & Plan Note (Signed)
 Chronic issue.  Somewhat controlled with Meloxicam  7.5 mg twice a week and 400 mg Acetaminophen  daily.  Discussed if renal function is worse compared to her previous visit, consider ortho evaluation. CMP ordered.  Refill on Meloxicam  7.5 mg sent. Patient counseled on s/e related to prolong NSAIDs use including increased risk of GI bleed, CKD. Avoid taking other NSAIDs.  This is also interfering with patient's ability to exercise regularly.  Also discussed potential podiatry/ physical therapy referral in the future if continues to be a problem.

## 2023-12-29 NOTE — Patient Instructions (Signed)
--   Moderate intensity exercise for 30 min each day or 150 min total in one week, exercise like walking, swimming, biking.

## 2023-12-29 NOTE — Assessment & Plan Note (Signed)
 Chronic.  Check A1c, if diabetic range briefly discussed starting Metformin 500 mg once a day.  Continue life style modifications with the following:  - weight loss through calorie-controlled, balanced diet, information on prediabetic diet provided. Increase moderate-intensity physical activity (at least 150 min/week of 30 min for 5 days a week). Given left ankle pain, patient will benefit from pharmacological intervention along lifestyle modification.  We will try intense life style modification for 8 weeks and have her f/u in 8 weeks to discuss if pharmacological intervention (GLP-1 RA Wegovy, Zepbound, Cvontrave).

## 2023-12-29 NOTE — Progress Notes (Signed)
 Established Patient Office Visit   Subjective  Patient ID: Heidi Hicks, female    DOB: 16-Oct-1960  Age: 63 y.o. MRN: [id number]  Chief Complaint  Patient presents with   Transitions Of Care    She  has a past medical history of Arthritis, BRBPR (bright red blood per rectum) (08/08/2020), COVID (04/05/2023), Hypertension, Hypophosphatemia (02/17/2023), and Postmenopausal bleeding (08/17/2020).  HPI Established patient of Dr. Lovetta Rucks presenting for transfer of care and medication check.   1) Left ankle pain: Broke left ankle when she was in her 20s needing surgery. She reports she has had left ankle pain for 10-15 years. She is currently taking Meloxicam  7.5 mg once or twice a week. She takes as needed Tylenol  2 tabs every morning. Patient has been seen in [facility] in the past and has seen [facility] as well. She has a boot given to her by ortho which she is wearing 4 times a week only when she is exercising.  - BMP 02/19/23: Cr 0.92, GFR >60  2) Hypertension: On Hyzaar 50-12.5 mg once a day. She denies chest pain, dizziness, lower leg edema, headache.   3) H/O psoriasis: No recent flare up. Not on any medication.   4) Prediabetes:  - A1C from 07/19/22 was 6.4% - Mixed home cooked and and fast food.  - Getting back to exercising but left ankle pain.   5) Desires to lose weight for improvement of overall health and wellness. Motivated to make lifestyle changes. However, has a h/o chronic left ankle pain which interferes with her ability to do certain activities.  - Currently walking 4 days a week.  - Does not drink alcohol. Denies concern for OSA.   ROS As per HPI    Objective:     BP 110/76   Pulse 76   Temp 97.6 F (36.4 C) (Oral)   Ht 5\' 9"  (1.753 m)   Wt 296 lb 3.2 oz (134.4 kg)   LMP 07/23/2011   SpO2 99%   BMI 43.74 kg/m      12/29/2023   10:15 AM 03/24/2023   10:09 AM 07/19/2022    9:06 AM  Depression screen PHQ 2/9  Decreased Interest 0 0 0  Down,  Depressed, Hopeless 0 0 0  PHQ - 2 Score 0 0 0  Altered sleeping 0 0   Tired, decreased energy 0 0   Change in appetite 0 0   Feeling bad or failure about yourself  0 0   Trouble concentrating 0 0   Moving slowly or fidgety/restless 0 0   Suicidal thoughts 0 0   PHQ-9 Score 0 0   Difficult doing work/chores Not difficult at all Not difficult at all       12/29/2023   10:16 AM 03/24/2023   10:09 AM  GAD 7 : Generalized Anxiety Score  Nervous, Anxious, on Edge 0 0  Control/stop worrying 0 0  Worry too much - different things 0 0  Trouble relaxing 0 0  Restless 0 0  Easily annoyed or irritable 0 0  Afraid - awful might happen 0 0  Total GAD 7 Score 0 0  Anxiety Difficulty Not difficult at all Not difficult at all    Physical Exam Constitutional:      Appearance: She is obese.  HENT:     Head: Normocephalic and atraumatic.     Right Ear: Tympanic membrane normal.     Left Ear: Tympanic membrane normal.     Mouth/Throat:  Mouth: Mucous membranes are moist.  Cardiovascular:     Pulses: Normal pulses.  Pulmonary:     Effort: Pulmonary effort is normal.     Breath sounds: Normal breath sounds.  Abdominal:     General: Abdomen is protuberant. Bowel sounds are normal.     Tenderness: There is no guarding.  Musculoskeletal:     Cervical back: Normal range of motion and neck supple. No rigidity.     Right lower leg: No edema.     Left lower leg: No edema.     Right ankle: No swelling. No tenderness. Normal range of motion. Normal pulse.     Left ankle: Swelling present. No tenderness. Decreased range of motion. Normal pulse.  Lymphadenopathy:     Cervical: No cervical adenopathy.  Neurological:     Mental Status: She is oriented to person, place, and time.  Psychiatric:        Mood and Affect: Mood normal.       No results found for any visits on 12/29/23.  The 10-year ASCVD risk score (Arnett DK, et al., 2019) is: 4.5%    Assessment & Plan:  Essential  hypertension Assessment & Plan: Chronic issue.  Adequately controlled. Continue Losartan -HCTZ 50-12.5 mg daily. Refill sent. Check CMP today.  Orders: -     Losartan  Potassium-HCTZ; Take 1 tablet by mouth daily.  Dispense: 90 tablet; Refill: 1  Chronic pain of left ankle Assessment & Plan: Chronic issue.  Somewhat controlled with Meloxicam  7.5 mg twice a week and 400 mg Acetaminophen  daily.  Discussed if renal function is worse compared to her previous visit, consider ortho evaluation. CMP ordered.  Refill on Meloxicam  7.5 mg sent. Patient counseled on s/e related to prolong NSAIDs use including increased risk of GI bleed, CKD. Avoid taking other NSAIDs.  This is also interfering with patient's ability to exercise regularly.  Also discussed potential podiatry/ physical therapy referral in the future if continues to be a problem.    Orders: -     Meloxicam ; Take 1 tablet (7.5 mg total) by mouth daily.  Dispense: 30 tablet; Refill: 1  Primary hypertension Assessment & Plan: Chronic issue.  Adequately controlled. Continue Losartan -HCTZ 50-12.5 mg daily. Refill sent. Check CMP today.  Orders: -     Hemoglobin A1c -     Lipid panel -     Comprehensive metabolic panel with GFR  Prediabetes Assessment & Plan: Chronic.  Check A1c, if diabetic range briefly discussed starting Metformin 500 mg once a day.  Continue life style modifications with the following:  - weight loss through calorie-controlled, balanced diet, information on prediabetic diet provided. Increase moderate-intensity physical activity (at least 150 min/week of 30 min for 5 days a week). Given left ankle pain, patient will benefit from pharmacological intervention along lifestyle modification.  We will try intense life style modification for 8 weeks and have her f/u in 8 weeks to discuss if pharmacological intervention (GLP-1 RA Wegovy, Zepbound, Cvontrave).    Morbid obesity with BMI of 40.0-44.9, adult  [facility]) Assessment & Plan: - Check vitamin D  - Weight loss through calorie-controlled, balanced diet, information on prediabetic diet provided. Increase moderate-intensity physical activity (at least 150 min/week of 30 min for 5 days a week). Given left ankle pain, patient will benefit from pharmacological intervention along lifestyle modification.  We will try intense life style modification for 8 weeks and have her f/u in 8 weeks to discuss if pharmacological intervention (GLP-1 RA Wegovy, Zepbound, Cvontrave).  Orders: -     VITAMIN D 25 Hydroxy (Vit-D Deficiency, Fractures)  - Patient is also due for Shingles and tetanus immunization. She was counseled to check with her local pharmacy to update her immunization.    I spent 40 minutes on the day of this face to face encounter reviewing patient's medical records, visit with previous PCP, surgical and non surgical procedures, recent  labs and imaging studies, counseling on weight loss, blood pressure management,  prediabetes, left ankle pain, swelling, reviewing the assessment and plan with patient, and post visit ordering and reviewing of  diagnostics and therapeutics with patient .    Return in about 8 weeks (around 02/23/2024) for For weight loss discussion..   Kory Panjwani, MD

## 2024-03-23 ENCOUNTER — Other Ambulatory Visit: Payer: Self-pay

## 2024-03-23 DIAGNOSIS — G8929 Other chronic pain: Secondary | ICD-10-CM

## 2024-03-25 NOTE — Telephone Encounter (Signed)
 1. Chronic pain of left ankle - meloxicam  (MOBIC ) 7.5 MG tablet; Take 1 tablet (7.5 mg total) by mouth daily as needed for pain.  Dispense: 30 tablet; Refill: 1  Lee-Anne Flicker, MD

## 2024-06-01 ENCOUNTER — Other Ambulatory Visit: Payer: Self-pay

## 2024-06-01 DIAGNOSIS — G8929 Other chronic pain: Secondary | ICD-10-CM

## 2024-07-04 ENCOUNTER — Other Ambulatory Visit: Payer: Self-pay

## 2024-07-04 DIAGNOSIS — Z1231 Encounter for screening mammogram for malignant neoplasm of breast: Secondary | ICD-10-CM

## 2024-07-23 ENCOUNTER — Encounter

## 2024-08-06 ENCOUNTER — Inpatient Hospital Stay: Admission: RE | Admit: 2024-08-06 | Discharge: 2024-08-06

## 2024-08-06 DIAGNOSIS — Z1231 Encounter for screening mammogram for malignant neoplasm of breast: Secondary | ICD-10-CM | POA: Insufficient documentation

## 2024-08-09 ENCOUNTER — Other Ambulatory Visit: Payer: Self-pay

## 2024-08-09 ENCOUNTER — Ambulatory Visit: Payer: Self-pay

## 2024-08-09 DIAGNOSIS — G8929 Other chronic pain: Secondary | ICD-10-CM

## 2024-09-25 ENCOUNTER — Other Ambulatory Visit: Payer: Self-pay

## 2024-09-25 DIAGNOSIS — I10 Essential (primary) hypertension: Secondary | ICD-10-CM
